# Patient Record
Sex: Female | Born: 1950 | Race: White | Hispanic: No | Smoking: Current every day smoker
Health system: Southern US, Community
[De-identification: ages and names within clinical notes are randomized; demographics above are authoritative.]

## PROBLEM LIST (undated history)

## (undated) DIAGNOSIS — F32A Depression, unspecified: Secondary | ICD-10-CM

## (undated) DIAGNOSIS — R748 Abnormal levels of other serum enzymes: Secondary | ICD-10-CM

## (undated) DIAGNOSIS — I10 Essential (primary) hypertension: Secondary | ICD-10-CM

## (undated) DIAGNOSIS — Z720 Tobacco use: Secondary | ICD-10-CM

## (undated) DIAGNOSIS — F1010 Alcohol abuse, uncomplicated: Secondary | ICD-10-CM

## (undated) DIAGNOSIS — Z87891 Personal history of nicotine dependence: Secondary | ICD-10-CM

## (undated) DIAGNOSIS — E039 Hypothyroidism, unspecified: Secondary | ICD-10-CM

## (undated) DIAGNOSIS — F419 Anxiety disorder, unspecified: Secondary | ICD-10-CM

## (undated) DIAGNOSIS — I951 Orthostatic hypotension: Secondary | ICD-10-CM

## (undated) DIAGNOSIS — E871 Hypo-osmolality and hyponatremia: Secondary | ICD-10-CM

## (undated) DIAGNOSIS — I071 Rheumatic tricuspid insufficiency: Secondary | ICD-10-CM

## (undated) DIAGNOSIS — E785 Hyperlipidemia, unspecified: Secondary | ICD-10-CM

## (undated) DIAGNOSIS — M810 Age-related osteoporosis without current pathological fracture: Secondary | ICD-10-CM

## (undated) DIAGNOSIS — N901 Moderate vulvar dysplasia: Secondary | ICD-10-CM

## (undated) DIAGNOSIS — D7589 Other specified diseases of blood and blood-forming organs: Secondary | ICD-10-CM

## (undated) DIAGNOSIS — F329 Major depressive disorder, single episode, unspecified: Secondary | ICD-10-CM

## (undated) DIAGNOSIS — J439 Emphysema, unspecified: Secondary | ICD-10-CM

## (undated) DIAGNOSIS — J45909 Unspecified asthma, uncomplicated: Secondary | ICD-10-CM

## (undated) HISTORY — DX: Hypothyroidism, unspecified: E03.9

## (undated) HISTORY — DX: Major depressive disorder, single episode, unspecified: F32.9

## (undated) HISTORY — DX: Anxiety disorder, unspecified: F41.9

## (undated) HISTORY — DX: Abnormal levels of other serum enzymes: R74.8

## (undated) HISTORY — PX: BREAST ENHANCEMENT SURGERY: SHX7

## (undated) HISTORY — DX: Hyperlipidemia, unspecified: E78.5

## (undated) HISTORY — PX: ABDOMINAL HYSTERECTOMY: SHX81

## (undated) HISTORY — DX: Other specified diseases of blood and blood-forming organs: D75.89

## (undated) HISTORY — DX: Personal history of nicotine dependence: Z87.891

## (undated) HISTORY — DX: Unspecified asthma, uncomplicated: J45.909

## (undated) HISTORY — DX: Hypo-osmolality and hyponatremia: E87.1

## (undated) HISTORY — DX: Moderate vulvar dysplasia: N90.1

## (undated) HISTORY — PX: SEPTOPLASTY: SUR1290

## (undated) HISTORY — DX: Depression, unspecified: F32.A

## (undated) HISTORY — DX: Age-related osteoporosis without current pathological fracture: M81.0

## (undated) HISTORY — DX: Alcohol abuse, uncomplicated: F10.10

## (undated) HISTORY — DX: Tobacco use: Z72.0

## (undated) HISTORY — DX: Orthostatic hypotension: I95.1

## (undated) HISTORY — DX: Emphysema, unspecified: J43.9

## (undated) HISTORY — PX: APPENDECTOMY: SHX54

## (undated) HISTORY — DX: Rheumatic tricuspid insufficiency: I07.1

## (undated) HISTORY — DX: Essential (primary) hypertension: I10

---

## 1988-08-08 HISTORY — PX: AUGMENTATION MAMMAPLASTY: SUR837

## 2005-05-27 ENCOUNTER — Ambulatory Visit: Payer: Self-pay | Admitting: Internal Medicine

## 2005-05-27 LAB — HM COLONOSCOPY

## 2014-03-12 LAB — HM PAP SMEAR

## 2014-04-25 LAB — HM MAMMOGRAPHY

## 2014-04-25 LAB — HM DEXA SCAN

## 2014-05-19 ENCOUNTER — Ambulatory Visit: Payer: Self-pay | Admitting: Obstetrics and Gynecology

## 2014-05-19 DIAGNOSIS — N890 Mild vaginal dysplasia: Secondary | ICD-10-CM | POA: Insufficient documentation

## 2014-05-19 LAB — HEMOGLOBIN: HGB: 13.6 g/dL (ref 12.0–16.0)

## 2014-05-26 ENCOUNTER — Ambulatory Visit: Payer: Self-pay | Admitting: Obstetrics and Gynecology

## 2014-05-28 LAB — PATHOLOGY REPORT

## 2014-08-27 ENCOUNTER — Ambulatory Visit: Payer: Self-pay | Admitting: Family Medicine

## 2014-08-27 DIAGNOSIS — I340 Nonrheumatic mitral (valve) insufficiency: Secondary | ICD-10-CM

## 2014-09-11 ENCOUNTER — Emergency Department: Payer: Self-pay | Admitting: Emergency Medicine

## 2014-09-11 LAB — COMPREHENSIVE METABOLIC PANEL
ALK PHOS: 92 U/L (ref 46–116)
ALT: 18 U/L (ref 14–63)
AST: 25 U/L (ref 15–37)
Albumin: 3.6 g/dL (ref 3.4–5.0)
Anion Gap: 6 — ABNORMAL LOW (ref 7–16)
BUN: 11 mg/dL (ref 7–18)
Bilirubin,Total: 0.6 mg/dL (ref 0.2–1.0)
Calcium, Total: 9.2 mg/dL (ref 8.5–10.1)
Chloride: 103 mmol/L (ref 98–107)
Co2: 28 mmol/L (ref 21–32)
Creatinine: 0.79 mg/dL (ref 0.60–1.30)
EGFR (African American): >60
EGFR (Non-African Amer.): >60
Glucose: 160 mg/dL — ABNORMAL HIGH (ref 65–99)
Osmolality: 277 (ref 275–301)
Potassium: 3.8 mmol/L (ref 3.5–5.1)
Sodium: 137 mmol/L (ref 136–145)
TOTAL PROTEIN: 6.8 g/dL (ref 6.4–8.2)

## 2014-09-11 LAB — CBC WITH DIFFERENTIAL/PLATELET
BASOS PCT: 1 %
Basophil #: 0.1 10*3/uL (ref 0.0–0.1)
Eosinophil #: 0.2 10*3/uL (ref 0.0–0.7)
Eosinophil %: 2 %
HCT: 42.9 % (ref 35.0–47.0)
HGB: 14.5 g/dL (ref 12.0–16.0)
Lymphocyte #: 2.3 10*3/uL (ref 1.0–3.6)
Lymphocyte %: 22.9 %
MCH: 34.1 pg — AB (ref 26.0–34.0)
MCHC: 33.9 g/dL (ref 32.0–36.0)
MCV: 101 fL — ABNORMAL HIGH (ref 80–100)
MONO ABS: 0.7 x10 3/mm (ref 0.2–0.9)
Monocyte %: 7 %
NEUTROS ABS: 6.8 10*3/uL — AB (ref 1.4–6.5)
NEUTROS PCT: 67.1 %
Platelet: 206 10*3/uL (ref 150–440)
RBC: 4.26 10*6/uL (ref 3.80–5.20)
RDW: 13.8 % (ref 11.5–14.5)
WBC: 10.2 10*3/uL (ref 3.6–11.0)

## 2014-09-17 ENCOUNTER — Ambulatory Visit (INDEPENDENT_AMBULATORY_CARE_PROVIDER_SITE_OTHER): Payer: Commercial Managed Care - HMO | Admitting: Cardiovascular Disease

## 2014-09-17 ENCOUNTER — Encounter: Payer: Self-pay | Admitting: Cardiovascular Disease

## 2014-09-17 VITALS — BP 130/82 | HR 80 | Ht 68.0 in | Wt 141.2 lb

## 2014-09-17 DIAGNOSIS — R9431 Abnormal electrocardiogram [ECG] [EKG]: Secondary | ICD-10-CM

## 2014-09-17 DIAGNOSIS — F17200 Nicotine dependence, unspecified, uncomplicated: Secondary | ICD-10-CM

## 2014-09-17 DIAGNOSIS — E785 Hyperlipidemia, unspecified: Secondary | ICD-10-CM

## 2014-09-17 DIAGNOSIS — I10 Essential (primary) hypertension: Secondary | ICD-10-CM

## 2014-09-17 DIAGNOSIS — I059 Rheumatic mitral valve disease, unspecified: Secondary | ICD-10-CM

## 2014-09-17 DIAGNOSIS — R0602 Shortness of breath: Secondary | ICD-10-CM

## 2014-09-17 DIAGNOSIS — I499 Cardiac arrhythmia, unspecified: Secondary | ICD-10-CM

## 2014-09-17 DIAGNOSIS — Z720 Tobacco use: Secondary | ICD-10-CM

## 2014-09-17 DIAGNOSIS — I951 Orthostatic hypotension: Secondary | ICD-10-CM

## 2014-09-17 MED ORDER — METOPROLOL SUCCINATE ER 50 MG PO TB24: 50.0000 mg | ORAL_TABLET | Freq: Every day | ORAL | Status: DC

## 2014-09-17 NOTE — Progress Notes (Signed)
Patient ID: Meghan Rivers, female    DOB: [birth year], 64 y.o.   MRN: [id number]  HPI Comments: Meghan Rivers is a pleasant 64 year old woman with a long history of smoking who continues to smoke one half pack per day, hypertension, hyperlipidemia who presents by referral from Dr. Sherie DonLada for abnormal EKG, mitral valve disease.  She has a history of alcohol, typically drinks 2-3 beers per day also history of depression, elevated liver enzymes, hypothyroidism  In follow-up today, she reports that she was on HCTZ for a long time as well as metoprolol succinate 100 mg daily. She had routine lab work in January 2016 and was found to have sodium 131. Follow-up sodium 137 one week later She is found to have labile blood pressures every fourth 2016. She recorded orthostatics with systolic pressure 73/46, pulse rate 106 when standing on Toprol 100 mg daily. She reports that she's been off HCTZ for approximately one month  She has now decreased the metoprolol down to 50 mg daily and recent blood pressure and heart rate has been well-controlled. Systolic pressures in the office today 130s. Orthostatics done with no drop in her blood pressure. Heart rate typically 70-90  She had echocardi[facility] one month ago that showed normal LV systolic function, normal RV systolic function, mitral annular calcification, mild MR, TR  EKG on today's visit shows normal sinus rhythm with rate 79 bpm, left axis deviation EKG done through primary care is essentially unchanged throughout EKG today   Allergies  Allergen Reactions  . Benazepril   . Erythromycin     Mouth pilled.     Outpatient Encounter Prescriptions as of 09/17/2014  Medication Sig  . albuterol (PROVENTIL HFA;VENTOLIN HFA) 108 (90 BASE) MCG/ACT inhaler Inhale into the lungs every 6 (six) hours as needed for wheezing or shortness of breath.  Marland Kitchen. aspirin 81 MG tablet Take 81 mg by mouth daily.  Marland Kitchen. atorvastatin (LIPITOR) 20 MG tablet Take 20 mg by mouth  daily.  . busPIRone (BUSPAR) 10 MG tablet Take 0.5 mg by mouth daily.  Marland Kitchen. levothyroxine (SYNTHROID, LEVOTHROID) 88 MCG tablet Take 88 mcg by mouth daily before breakfast.  . metoprolol succinate (TOPROL-XL) 50 MG 24 hr tablet Take 1 tablet (50 mg total) by mouth daily. Take with or immediately following a meal.  . [DISCONTINUED] metoprolol succinate (TOPROL-XL) 100 MG 24 hr tablet Take 50 mg by mouth daily. Take with or immediately following a meal.    Past Medical History  Diagnosis Date  . Hypertension   . Thyroid disease   . Hyperlipidemia   . Hypothyroidism   . Mitral annular calcification   . Mitral regurgitation   . Osteoporosis   . Anxiety and depression     Past Surgical History  Procedure Laterality Date  . Appendectomy    . Breast enhancement surgery    . Septoplasty    . Abdominal hysterectomy      Social History  reports that she has been smoking Cigarettes.  She has a 20 pack-year smoking history. She does not have any smokeless tobacco history on file. She reports that she drinks about 1.2 oz of alcohol per week. She reports that she does not use illicit drugs.  Family History family history includes Heart attack (age of onset: 2361) in her brother; Heart attack (age of onset: 162) in her father; Heart disease in her brother and father; Heart failure in her father; Hyperlipidemia in her father; Hypertension in her father.  Review of Systems  Constitutional: Negative.   HENT: Negative.   Eyes: Negative.   Respiratory: Negative.   Cardiovascular: Negative.   Gastrointestinal: Negative.   Endocrine: Negative.   Musculoskeletal: Negative.   Skin: Negative.   Allergic/Immunologic: Negative.   Neurological: Negative.   Hematological: Negative.   Psychiatric/Behavioral: Negative.   All other systems reviewed and are negative.   BP 130/82 mmHg  Pulse 80  Ht  (1.727 m)  Wt 141 lb 4 oz (64.071 kg)  BMI 21.48 kg/m2  Physical Exam  Constitutional:  She is oriented to person, place, and time. She appears well-developed and well-nourished.  HENT:  Head: Normocephalic.  Nose: Nose normal.  Mouth/Throat: Oropharynx is clear and moist.  Eyes: Conjunctivae are normal. Pupils are equal, round, and reactive to light.  Neck: Normal range of motion. Neck supple. No JVD present.  Cardiovascular: Normal rate, regular rhythm, S1 normal, S2 normal, normal heart sounds and intact distal pulses.  Exam reveals no gallop and no friction rub.   No murmur heard. Pulmonary/Chest: Effort normal and breath sounds normal. No respiratory distress. She has no wheezes. She has no rales. She exhibits no tenderness.  Abdominal: Soft. Bowel sounds are normal. She exhibits no distension. There is no tenderness.  Musculoskeletal: Normal range of motion. She exhibits no edema or tenderness.  Lymphadenopathy:    She has no cervical adenopathy.  Neurological: She is alert and oriented to person, place, and time. Coordination normal.  Skin: Skin is warm and dry. No rash noted. No erythema.  Psychiatric: She has a normal mood and affect. Her behavior is normal. Judgment and thought content normal.    Assessment and Plan  Nursing note and vitals reviewed.

## 2014-09-17 NOTE — Assessment & Plan Note (Signed)
She takes Lipitor 20 mg every other day. Given her smoking, suggested aggressive cholesterol management, total cholesterol less than 150.

## 2014-09-17 NOTE — Assessment & Plan Note (Signed)
Blood pressure has been much better controlled on metoprolol succinate 50 mg daily. Encouraged her to use the lower dose, continue to hold HCTZ. Unclear if her nutrition or fluid intake was playing a role in her orthostasis. Significant alcohol intake. She is drinking more Gatorade now

## 2014-09-17 NOTE — Assessment & Plan Note (Signed)
Recent echocardiogram with mitral annular calcification. Likely not of any clinical significance. Mild MR and TR. Also does not need to be closely monitored. Normal ejection fraction

## 2014-09-17 NOTE — Assessment & Plan Note (Signed)
Blood pressure is well controlled on today's visit. No changes made to the medications. Suggested she stay on low-dose metoprolol succinate 50 mg daily

## 2014-09-17 NOTE — Patient Instructions (Addendum)
You are doing well.  No medication changes were made. Continue the metoprolol succinate 50 mg daily  Goal total cholesterol is <150, LDL <70  Try to quit smoking If you have dizzy spells, drops in blood pressure, call the office   Please call us if you have new issues that need to be addressed before your next appt.

## 2014-09-17 NOTE — Assessment & Plan Note (Signed)
We have encouraged her to continue to work on weaning her cigarettes and smoking cessation. She will continue to work on this and does not want any assistance with chantix.  

## 2014-11-29 NOTE — Op Note (Signed)
PATIENT NAME:  Meghan EllisBASS, Deronda D MR#:  [id number] DATE OF BIRTH:  [birth year]  DATE OF PROCEDURE:  05/26/2014  PREOPERATIVE DIAGNOSIS: VIN 3.  POSTOPERATIVE DIAGNOSIS: VIN 3.   OPERATION: Vulvar wide local excision.   ANESTHESIA: General.   SURGEON: Lynanne Delgreco S. Valentino Saxonherry, MD  ESTIMATED BLOOD LOSS: Minimal.   OPERATIVE FLUIDS: 800 mL   URINE OUTPUT: 10 mL   COMPLICATIONS: None.   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 Acetyl white changes were also noted in this region after application of acetic acid. Mild aceto-white change was also noted on the right labia majora at the junction of the labia minora near the region of the clitoris.   SPECIMEN: Vulvar biopsy x 2.   CONDITION: Stable.   PROCEDURE: The patient was taken to the operating room where she was prepped and draped in normal sterile fashion, and placed in the dorsal lithotomy position using Allen stirrups. The bladder was then drained using a straight catheter with approximately 10 mL of clear urine returned. Next, 5% acetic acid was applied to the patient's entire vulva, and observation for any acetyl white changes was done. The areas with acetyl white changes were as noted above.   A scalpel was used to incise the skin surrounding the aceto-white lesions with a clear margin of approximately 1 cm, and electrocautery was used to undermine this. These specimens were then sent to the pathologist for evaluation.  After removal of the specimens, the wounds were closed primarily with subcutaneous interrupted sutures of 0 chromic suture.   The final sponge, needle and instrument counts were correct at the completion of the procedure. The patient was taken to the PACU in stable condition.    ____________________________ Jacques EarthlyAnika S. Valentino Saxonherry, MD asc:MT D: 05/29/2014 17:27:49 ET T: 05/30/2014 09:04:47 ET JOB#: [id number]  cc: Jacques EarthlyAnika S. Valentino Saxonherry, MD,  <Dictator> Fabian NovemberANIKA S Adorian Gwynne MD ELECTRONICALLY SIGNED 06/09/2014 7:57

## 2015-04-07 ENCOUNTER — Other Ambulatory Visit: Payer: Self-pay

## 2015-04-07 NOTE — Telephone Encounter (Signed)
Routing to provider  

## 2015-05-06 ENCOUNTER — Other Ambulatory Visit: Payer: Self-pay

## 2015-05-06 DIAGNOSIS — F1010 Alcohol abuse, uncomplicated: Secondary | ICD-10-CM | POA: Insufficient documentation

## 2015-05-06 DIAGNOSIS — F32A Depression, unspecified: Secondary | ICD-10-CM | POA: Insufficient documentation

## 2015-05-06 DIAGNOSIS — D7589 Other specified diseases of blood and blood-forming organs: Secondary | ICD-10-CM | POA: Insufficient documentation

## 2015-05-06 DIAGNOSIS — D071 Carcinoma in situ of vulva: Secondary | ICD-10-CM | POA: Insufficient documentation

## 2015-05-06 DIAGNOSIS — F329 Major depressive disorder, single episode, unspecified: Secondary | ICD-10-CM | POA: Insufficient documentation

## 2015-05-06 DIAGNOSIS — M810 Age-related osteoporosis without current pathological fracture: Secondary | ICD-10-CM | POA: Insufficient documentation

## 2015-05-06 DIAGNOSIS — Z720 Tobacco use: Secondary | ICD-10-CM | POA: Insufficient documentation

## 2015-05-06 DIAGNOSIS — I3481 Nonrheumatic mitral (valve) annulus calcification: Secondary | ICD-10-CM | POA: Insufficient documentation

## 2015-05-06 DIAGNOSIS — F419 Anxiety disorder, unspecified: Secondary | ICD-10-CM | POA: Insufficient documentation

## 2015-05-06 DIAGNOSIS — J45909 Unspecified asthma, uncomplicated: Secondary | ICD-10-CM | POA: Insufficient documentation

## 2015-05-06 DIAGNOSIS — R748 Abnormal levels of other serum enzymes: Secondary | ICD-10-CM | POA: Insufficient documentation

## 2015-05-06 DIAGNOSIS — E871 Hypo-osmolality and hyponatremia: Secondary | ICD-10-CM | POA: Insufficient documentation

## 2015-05-06 DIAGNOSIS — E039 Hypothyroidism, unspecified: Secondary | ICD-10-CM | POA: Insufficient documentation

## 2015-05-06 DIAGNOSIS — I059 Rheumatic mitral valve disease, unspecified: Secondary | ICD-10-CM | POA: Insufficient documentation

## 2015-05-06 MED ORDER — BUSPIRONE HCL 10 MG PO TABS: 10.0000 mg | ORAL_TABLET | Freq: Two times a day (BID) | ORAL | Status: DC | PRN

## 2015-05-06 NOTE — Telephone Encounter (Signed)
rx approved

## 2015-05-11 ENCOUNTER — Ambulatory Visit (INDEPENDENT_AMBULATORY_CARE_PROVIDER_SITE_OTHER): Payer: Commercial Managed Care - HMO | Admitting: Family Medicine

## 2015-05-11 ENCOUNTER — Ambulatory Visit
Admission: RE | Admit: 2015-05-11 | Discharge: 2015-05-11 | Disposition: A | Discharge status: Home / Self Care | Payer: Commercial Managed Care - HMO | Source: Ambulatory Visit | Attending: Family Medicine | Admitting: Family Medicine

## 2015-05-11 ENCOUNTER — Telehealth: Payer: Self-pay | Admitting: Family Medicine

## 2015-05-11 ENCOUNTER — Ambulatory Visit: Payer: Commercial Managed Care - HMO

## 2015-05-11 ENCOUNTER — Encounter: Payer: Self-pay | Admitting: Family Medicine

## 2015-05-11 VITALS — BP 136/87 | HR 75 | Temp 98.4°F | Ht 66.2 in | Wt 132.0 lb

## 2015-05-11 DIAGNOSIS — I10 Essential (primary) hypertension: Secondary | ICD-10-CM | POA: Diagnosis not present

## 2015-05-11 DIAGNOSIS — N901 Moderate vulvar dysplasia: Secondary | ICD-10-CM | POA: Diagnosis not present

## 2015-05-11 DIAGNOSIS — R748 Abnormal levels of other serum enzymes: Secondary | ICD-10-CM | POA: Diagnosis not present

## 2015-05-11 DIAGNOSIS — E038 Other specified hypothyroidism: Secondary | ICD-10-CM | POA: Diagnosis not present

## 2015-05-11 DIAGNOSIS — D7589 Other specified diseases of blood and blood-forming organs: Secondary | ICD-10-CM

## 2015-05-11 DIAGNOSIS — F1010 Alcohol abuse, uncomplicated: Secondary | ICD-10-CM

## 2015-05-11 DIAGNOSIS — R634 Abnormal weight loss: Secondary | ICD-10-CM

## 2015-05-11 DIAGNOSIS — F17200 Nicotine dependence, unspecified, uncomplicated: Secondary | ICD-10-CM | POA: Diagnosis present

## 2015-05-11 DIAGNOSIS — Z720 Tobacco use: Secondary | ICD-10-CM | POA: Diagnosis not present

## 2015-05-11 DIAGNOSIS — E785 Hyperlipidemia, unspecified: Secondary | ICD-10-CM | POA: Diagnosis not present

## 2015-05-11 NOTE — Assessment & Plan Note (Signed)
Check lipids and sgpt today; diet discussed

## 2015-05-11 NOTE — Assessment & Plan Note (Addendum)
This has been cut down; praise given, but explained recommended limits are still no more than 7 drinks per week, no more than 3 drinks on any one occasion; offered help, here to help if needed; see AVS

## 2015-05-11 NOTE — Patient Instructions (Addendum)
Please have the chest xray done today or tomorrow at [facility] contact you about the labs and xray results If you have not heard anything from my staff in a week about any orders/referrals/studies from today, please contact us here to follow-up [phone] Do try to cut back further on your alcohol intake and tobacco use  I recommend a chest CT to screen for lung cancer; if you change your mind and would like to have that done, please let me know   Alcohol and Nutrition Nutrition serves two purposes. It provides energy. It also maintains body structure and function. Food supplies energy. It also provides the building blocks needed to replace worn or damaged cells. Alcoholics often eat poorly. This limits their supply of essential nutrients. This affects energy supply and structure maintenance. Alcohol also affects the body's nutrients in:  Digestion.  Storage.  Using and getting rid of waste products. IMPAIRMENT OF NUTRIENT DIGESTION AND UTILIZATION   Once ingested, food must be broken down into small components (digested). Then it is available for energy. It helps maintain body structure and function. Digestion begins in the mouth. It continues in the stomach and intestines, with help from the pancreas. The nutrients from digested food are absorbed from the intestines into the blood. Then they are carried to the liver. The liver prepares nutrients for:  Immediate use.  Storage and future use.  Alcohol inhibits the breakdown of nutrients into usable molecules.  It decreases secretion of digestive enzymes from the pancreas.  Alcohol impairs nutrient absorption by damaging the cells lining the stomach and intestines.  It also interferes with moving some nutrients into the blood.  In addition, nutritional deficiencies themselves may lead to further absorption problems.  For example, folate deficiency changes the cells that line the small intestine. This  impairs how water is absorbed. It also affects absorbed nutrients. These include glucose, sodium, and additional folate.  Even if nutrients are digested and absorbed, alcohol can prevent them from being fully used. It changes their transport, storage, and excretion. Impaired utilization of nutrients by alcoholics is indicated by:  Decreased liver stores of vitamins, such as vitamin A.  Increased excretion of nutrients such as fat. ALCOHOL AND ENERGY SUPPLY   Three basic nutritional components found in food are:  Carbohydrates.  Proteins.  Fats.  These are used as energy. Some alcoholics take in as much as 50% of their total daily calories from alcohol. They often neglect important foods.  Even when enough food is eaten, alcohol can impair the ways the body controls blood sugar (glucose) levels. It may either increase or decrease blood sugar.  In non-diabetic alcoholics, increased blood sugar (hyperglycemia) is caused by poor insulin secretion. It is usually temporary.  Decreased blood sugar (hypoglycemia) can cause serious injury even if this condition is short-lived. Low blood sugar can happen when a fasting or malnourished person drinks alcohol. When there is no food to supply energy, stored sugar is used up. The products of alcohol inhibit forming glucose from other compounds such as amino acids. As a result, alcohol causes the brain and other body tissue to lack glucose. It is needed for energy and function.  Alcohol is an energy source. But how the body processes and uses the energy from alcohol is complex. Also, when alcohol is substituted for carbohydrates, subjects tend to lose weight. This indicates that they get less energy from alcohol than from food. ALCOHOL - MAINTAINING CELL STRUCTURE AND FUNCTION  Structure  Cells are made mostly of protein. So an adequate protein diet is important for maintaining cell structure. This is especially true if cells are being damaged. Research  indicates that alcohol affects protein nutrition by causing impaired:  Digestion of proteins to amino acids.  Processing of amino acids by the small intestine and liver.  Synthesis of proteins from amino acids.  Protein secretion by the liver. Function Nutrients are essential for the body to function well. They provide the tools that the body needs to work well:   Proteins.  Vitamins.  Minerals. Alcohol can disrupt body function. It may cause nutrient deficiencies. And it may interfere with the way nutrients are processed. Vitamins  Vitamins are essential to maintain growth and normal metabolism. They regulate many of the body`s processes. Chronic heavy drinking causes deficiencies in many vitamins. This is caused by eating less. And, in some cases, vitamins may be poorly absorbed. For example, alcohol inhibits fat absorption. It impairs how the vitamins A, E, and D are normally absorbed along with dietary fats. Not enough vitamin A may cause night blindness. Not enough vitamin D may cause softening of the bones.  Some alcoholics lack vitamins A, C, D, E, K, and the B vitamins. These are all involved in wound healing and cell maintenance. In particular, because vitamin K is necessary for blood clotting, lacking that vitamin can cause delayed clotting. The result is excess bleeding. Lacking other vitamins involved in brain function may cause severe neurological damage. Minerals Deficiencies of minerals such as calcium, magnesium, iron, and zinc are common in alcoholics. The alcohol itself does not seem to affect how these minerals are absorbed. Rather, they seem to occur secondary to other alcohol-related problems, such as:  Less calcium absorbed.  Not enough magnesium.  More urinary excretion.  Vomiting.  Diarrhea.  Not enough iron due to gastrointestinal bleeding.  Not enough zinc or losses related to other nutrient deficiencies.  Mineral deficiencies can cause a variety of  medical consequences. These range from calcium-related bone disease to zinc-related night blindness and skin lesions. ALCOHOL, MALNUTRITION, AND MEDICAL COMPLICATIONS  Liver Disease   Alcoholic liver damage is caused primarily by alcohol itself. But poor nutrition may increase the risk of alcohol-related liver damage. For example, nutrients normally found in the liver are known to be affected by drinking alcohol. These include carotenoids, which are the major sources of vitamin A, and vitamin E compounds. Decreases in such nutrients may play some role in alcohol-related liver damage. Pancreatitis  Research suggests that malnutrition may increase the risk of developing alcoholic pancreatitis. Research suggests that a diet lacking in protein may increase alcohol's damaging effect on the pancreas. Brain  Nutritional deficiencies may have severe effects on brain function. These may be permanent. Specifically, thiamine deficiencies are often seen in alcoholics. They can cause severe neurological problems. These include:  Impaired movement.  Memory loss seen in Wernicke-Korsakoff syndrome. Pregnancy  Alcohol has toxic effects on fetal development. It causes alcohol-related birth defects. They include fetal alcohol syndrome. Alcohol itself is toxic to the fetus. Also, the nutritional deficiency can affect how the fetus develops. That may compound the risk of developmental damage.  Nutritional needs during pregnancy are 10% to 30% greater than normal. Food intake can increase by as much as 140% to cover the needs of both mother and fetus. An alcoholic mother`s nutritional problems may adversely affect the nutrition of the fetus. And alcohol itself can also restrict nutrition flow to the fetus. NUTRITIONAL STATUS OF ALCOHOLICS  Techniques for assessing nutritional status include:  Taking body measurements to estimate fat reserves. They include:  Weight.  Height.  Mass.  Skin fold  thickness.  Performing blood analysis to provide measurements of circulating:  Proteins.  Vitamins.  Minerals.  These techniques tend to be imprecise. For many nutrients, there is no clear "cut-off" point that would allow an accurate definition of deficiency. So assessing the nutritional status of alcoholics is limited by these techniques. Dietary status may provide information about the risk of developing nutritional problems. Dietary status is assessed by:  Taking patients' dietary histories.  Evaluating the amount and types of food they are eating.  It is difficult to determine what exact amount of alcohol begins to have damaging effects on nutrition. In general, moderate drinkers have 2 drinks or less per day. They seem to be at little risk for nutritional problems. Various medical disorders begin to appear at greater levels.  Research indicates that the majority of even the heaviest drinkers have few obvious nutritional deficiencies. Many alcoholics who are hospitalized for medical complications of their disease do have severe malnutrition. Alcoholics tend to eat poorly. Often they eat less than the amounts of food necessary to provide enough:  Carbohydrates.  Protein.  Fat.  Vitamins A and C.  B vitamins.  Minerals like calcium and iron. Of major concern is alcohol's effect on digesting food and use of nutrients. It may shift a mildly malnourished person toward severe malnutrition. Document Released: 05/19/2005 Document Revised: 10/17/2011 Document Reviewed: 11/02/2005 [facility] Patient Information 2015 [place], [place]. This information is not intended to replace advice given to you by your health care provider. Make sure you discuss any questions you have with your health care provider. Smoking Hazards Smoking cigarettes is extremely bad for your health. Tobacco smoke has over 200 known poisons in it. It contains the poisonous gases nitrogen oxide and carbon monoxide. There  are over 60 chemicals in tobacco smoke that cause cancer. Some of the chemicals found in cigarette smoke include:   Cyanide.   Benzene.   Formaldehyde.   Methanol (wood alcohol).   Acetylene (fuel used in welding torches).   Ammonia.  Even smoking lightly shortens your life expectancy by several years. You can greatly reduce the risk of medical problems for you and your family by stopping now. Smoking is the most preventable cause of death and disease in our society. Within days of quitting smoking, your circulation improves, you decrease the risk of having a heart attack, and your lung capacity improves. There may be some increased phlegm in the first few days after quitting, and it may take months for your lungs to clear up completely. Quitting for 10 years reduces your risk of developing lung cancer to almost that of a nonsmoker.  WHAT ARE THE RISKS OF SMOKING? Cigarette smokers have an increased risk of many serious medical problems, including:  Lung cancer.   Lung disease (such as pneumonia, bronchitis, and emphysema).   Heart attack and chest pain due to the heart not getting enough oxygen (angina).   Heart disease and peripheral blood vessel disease.   Hypertension.   Stroke.   Oral cancer (cancer of the lip, mouth, or voice box).   Bladder cancer.   Pancreatic cancer.   Cervical cancer.   Pregnancy complications, including premature birth.   Stillbirths and smaller newborn babies, birth defects, and genetic damage to sperm.   Early menopause.   Lower estrogen level for women.   Infertility.   Facial  wrinkles.   Blindness.   Increased risk of broken bones (fractures).   Senile dementia.   Stomach ulcers and internal bleeding.   Delayed wound healing and increased risk of complications during surgery. Because of secondhand smoke exposure, children of smokers have an increased risk of the following:   Sudden infant death  syndrome (SIDS).   Respiratory infections.   Lung cancer.   Heart disease.   Ear infections.  WHY IS SMOKING ADDICTIVE? Nicotine is the chemical agent in tobacco that is capable of causing addiction or dependence. When you smoke and inhale, nicotine is absorbed rapidly into the bloodstream through your lungs. Both inhaled and noninhaled nicotine may be addictive.  WHAT ARE THE BENEFITS OF QUITTING?  There are many health benefits to quitting smoking. Some are:   The likelihood of developing cancer and heart disease decreases. Health improvements are seen almost immediately.   Blood pressure, pulse rate, and breathing patterns start returning to normal soon after quitting.   People who quit may see an improvement in their overall quality of life.  HOW DO YOU QUIT SMOKING? Smoking is an addiction with both physical and psychological effects, and longtime habits can be hard to change. Your health care provider can recommend:  Programs and community resources, which may include group support, education, or therapy.  Replacement products, such as patches, gum, and nasal sprays. Use these products only as directed. Do not replace cigarette smoking with electronic cigarettes (commonly called e-cigarettes). The safety of e-cigarettes is unknown, and some may contain harmful chemicals. FOR MORE INFORMATION  American Lung Association: www.lung.org  American Cancer Society: www.cancer.org Document Released: 09/01/2004 Document Revised: 05/15/2013 Document Reviewed: 01/14/2013 [facility] Patient Information 2015 [place], [place]. This information is not intended to replace advice given to you by your health care provider. Make sure you discuss any questions you have with your health care provider. Smoking Cessation, Tips for Success If you are ready to quit smoking, congratulations! You have chosen to help yourself be healthier. Cigarettes bring nicotine, tar, carbon monoxide, and other  irritants into your body. Your lungs, heart, and blood vessels will be able to work better without these poisons. There are many different ways to quit smoking. Nicotine gum, nicotine patches, a nicotine inhaler, or nicotine nasal spray can help with physical craving. Hypnosis, support groups, and medicines help break the habit of smoking. WHAT THINGS CAN I DO TO MAKE QUITTING EASIER?  Here are some tips to help you quit for good:  Pick a date when you will quit smoking completely. Tell all of your friends and family about your plan to quit on that date.  Do not try to slowly cut down on the number of cigarettes you are smoking. Pick a quit date and quit smoking completely starting on that day.  Throw away all cigarettes.   Clean and remove all ashtrays from your home, work, and car.  On a card, write down your reasons for quitting. Carry the card with you and read it when you get the urge to smoke.  Cleanse your body of nicotine. Drink enough water and fluids to keep your urine clear or pale yellow. Do this after quitting to flush the nicotine from your body.  Learn to predict your moods. Do not let a bad situation be your excuse to have a cigarette. Some situations in your life might tempt you into wanting a cigarette.  Never have "just one" cigarette. It leads to wanting another and another. Remind yourself of your  decision to quit.  Change habits associated with smoking. If you smoked while driving or when feeling stressed, try other activities to replace smoking. Stand up when drinking your coffee. Brush your teeth after eating. Sit in a different chair when you read the paper. Avoid alcohol while trying to quit, and try to drink fewer caffeinated beverages. Alcohol and caffeine may urge you to smoke.  Avoid foods and drinks that can trigger a desire to smoke, such as sugary or spicy foods and alcohol.  Ask people who smoke not to smoke around you.  Have something planned to do right  after eating or having a cup of coffee. For example, plan to take a walk or exercise.  Try a relaxation exercise to calm you down and decrease your stress. Remember, you may be tense and nervous for the first 2 weeks after you quit, but this will pass.  Find new activities to keep your hands busy. Play with a pen, coin, or rubber band. Doodle or draw things on paper.  Brush your teeth right after eating. This will help cut down on the craving for the taste of tobacco after meals. You can also try mouthwash.   Use oral substitutes in place of cigarettes. Try using lemon drops, carrots, cinnamon sticks, or chewing gum. Keep them handy so they are available when you have the urge to smoke.  When you have the urge to smoke, try deep breathing.  Designate your home as a nonsmoking area.  If you are a heavy smoker, ask your health care provider about a prescription for nicotine chewing gum. It can ease your withdrawal from nicotine.  Reward yourself. Set aside the cigarette money you save and buy yourself something nice.  Look for support from others. Join a support group or smoking cessation program. Ask someone at home or at work to help you with your plan to quit smoking.  Always ask yourself, "Do I need this cigarette or is this just a reflex?" Tell yourself, "Today, I choose not to smoke," or "I do not want to smoke." You are reminding yourself of your decision to quit.  Do not replace cigarette smoking with electronic cigarettes (commonly called e-cigarettes). The safety of e-cigarettes is unknown, and some may contain harmful chemicals.  If you relapse, do not give up! Plan ahead and think about what you will do the next time you get the urge to smoke. HOW WILL I FEEL WHEN I QUIT SMOKING? You may have symptoms of withdrawal because your body is used to nicotine (the addictive substance in cigarettes). You may crave cigarettes, be irritable, feel very hungry, cough often, get headaches, or  have difficulty concentrating. The withdrawal symptoms are only temporary. They are strongest when you first quit but will go away within 10-14 days. When withdrawal symptoms occur, stay in control. Think about your reasons for quitting. Remind yourself that these are signs that your body is healing and getting used to being without cigarettes. Remember that withdrawal symptoms are easier to treat than the major diseases that smoking can cause.  Even after the withdrawal is over, expect periodic urges to smoke. However, these cravings are generally short lived and will go away whether you smoke or not. Do not smoke! WHAT RESOURCES ARE AVAILABLE TO HELP ME QUIT SMOKING? Your health care provider can direct you to community resources or hospitals for support, which may include:  Group support.  Education.  Hypnosis.  Therapy. Document Released: 04/22/2004 Document Revised: 12/09/2013 Document Reviewed:  01/10/2013 [employer] Patient Information 2015 [place], [place]. This information is not intended to replace advice given to you by your health care provider. Make sure you discuss any questions you have with your health care provider.

## 2015-05-11 NOTE — Assessment & Plan Note (Signed)
She is not ready to quit yet, I am here to help if/when needed

## 2015-05-11 NOTE — Assessment & Plan Note (Signed)
Check TSH and free T4 today; adjust medicine if needed

## 2015-05-11 NOTE — Assessment & Plan Note (Signed)
Check LFTs today; hope improvement with less alcohol intake

## 2015-05-11 NOTE — Assessment & Plan Note (Signed)
Expect this to have improved with B12 and folic acid and reduced alcohol intake; numbness has iimproved as well; check CBC and then f/u with other labs if needed

## 2015-05-11 NOTE — Progress Notes (Signed)
BP 136/87 mmHg  Pulse 75  Temp(Src) 98.4 F (36.9 C)  Ht 5' 6.2" (1.681 m)  Wt 132 lb (59.875 kg)  BMI 21.19 kg/m2  SpO2 99%   Subjective:    Patient ID: Meghan Rivers, female    DOB: 1950-08-11, 64 y.o.   MRN: [id number]  HPI: Meghan Rivers is a 64 y.o. female  Chief Complaint  Patient presents with  . Hypertension    patient states that she was getting dizzy, she checked her BP, it was low she at that time she cut ther BP meds in half. She is now taking a whole tablet  . Hypothyroidism  . Hyperlipidemia   She had a few days of feeling dizzy; salting her foods, drinking Gatorade; cut metoporlol in half about 3 weeks ago No Lynn Ito since Sept 1st; her favorite type of alcohol; drinking 2 or 2.5 beers a day and brandy in the evening; also drinking iced tea Drinking at least 2 liters of fluids daily; "addicted to water in the mornings", drinking 6 glasses of water in the morning, before lunch, 6 ounce glasses; then 2 glasses of tea, slightly sweetened; has cut back on coffee  She has lost 9 pounds since February; no appetite really; she has not lost the sense of taste or smell; nothing really sounds good to eat; not really hungry; no early satiety; will get a little hungry, but never "starving"  Occasional hot flash since the thyroid med adjustment, we went down from 88 to 75 mcg daily; she toyed with whether or not to go back up to 88; she has been taking 75 mcg every day; bowels are regular  Macrocytosis; taking B12 and folic acid; numbness in the feet improved  High cholesterol; using olive oil when cooking; using coconut oil but not with cooking; not many eggs; not much sausage, hot dogs; does have pepperoni and ham at [place] [employer] once a week; rarely eats cheeseburgers and french fries; does eat BLT; cheese eating comes in spells; does not drink milk much; taking statin every other day instead of daily because of concerns of what she has heard about gray matter  Relevant  past medical, surgical, family and social history reviewed and updated as indicated. Interim medical history since our last visit reviewed. Allergies and medications reviewed and updated.  Review of Systems  Constitutional: Positive for unexpected weight change.  HENT: Positive for postnasal drip. Negative for nosebleeds.   Eyes: Negative for visual disturbance.  Respiratory: Positive for cough (occasional tickle in the throat, no sputum, no blood, just sinus drainage).   Gastrointestinal: Negative for blood in stool.  Endocrine: Positive for cold intolerance. Negative for heat intolerance, polydipsia (she declined excessive thirst, just trying to stay hydrated) and polyphagia.  Genitourinary: Positive for vaginal discharge. Negative for hematuria.  Musculoskeletal: Positive for arthralgias. Negative for joint swelling.       Only has some hand discomfort if playing games too long or making candy too long  Skin:       Rough brown bump on the left forearm  Neurological: Positive for numbness. Negative for tremors, weakness (feels lazy but not weak) and headaches.  Psychiatric/Behavioral: Positive for dysphoric mood ("I think I've been depressed but I'm getting over that").   Per HPI unless specifically indicated above     Objective:    BP 136/87 mmHg  Pulse 75  Temp(Src) 98.4 F (36.9 C)  Ht 5' 6.2" (1.681 m)  Wt 132 lb (59.875 kg)  BMI 21.19 kg/m2  SpO2 99%  Wt Readings from Last 3 Encounters:  05/11/15 132 lb (59.875 kg)  12/02/14 138 lb (62.596 kg)  09/17/14 141 lb 4 oz (64.071 kg)    Physical Exam  Constitutional: She appears well-developed and well-nourished. No distress.  Weight down 6 pounds in 5-1/2 months, down 9 pounds in about 8 months  HENT:  Head: Normocephalic and atraumatic.  Eyes: EOM are normal. No scleral icterus.  Neck: No thyromegaly present.  Cardiovascular: Normal rate, regular rhythm and normal heart sounds.   No murmur heard. Pulmonary/Chest: Effort  normal and breath sounds normal. No respiratory distress. She has no wheezes.  Abdominal: Soft. Bowel sounds are normal. She exhibits no distension.  Musculoskeletal: Normal range of motion. She exhibits no edema.  Neurological: She is alert. She displays no tremor. She exhibits normal muscle tone. Gait normal.  Sensation intact to monofilament toes left foot  Skin: Skin is warm and dry. She is not diaphoretic. No pallor.  Psychiatric: She has a normal mood and affect. Her behavior is normal. Judgment and thought content normal.   Results for orders placed or performed in visit on 05/06/15  HM MAMMOGRAPHY  Result Value Ref Range   HM Mammogram per PP   HM DEXA SCAN  Result Value Ref Range   HM Dexa Scan per PP   HM PAP SMEAR  Result Value Ref Range   HM Pap smear per PP   HM COLONOSCOPY  Result Value Ref Range   HM Colonoscopy per PP       Assessment & Plan:   Problem List Items Addressed This Visit      Cardiovascular and Mediastinum   Essential hypertension    Agree with lower dose of the medicine when dizzy, but she is back on 50 mg now and well-controlled today        Endocrine   Hypothyroidism - Primary    Check TSH and free T4 today; adjust medicine if needed      Relevant Orders   TSH   T4, free     Musculoskeletal and Integument   Vulvar intraepithelial neoplasia II    Followed by Dr. Valentino Saxon, sees her next month        Other   Hyperlipidemia    Check lipids and sgpt today; diet discussed      Relevant Orders   Lipid Panel w/o Chol/HDL Ratio   Elevated liver enzymes    Check LFTs today; hope improvement with less alcohol intake      Relevant Orders   Comprehensive metabolic panel   Tobacco use    She is not ready to quit yet, I am here to help if/when needed      Relevant Orders   DG Chest 2 View   Macrocytosis    Expect this to have improved with B12 and folic acid and reduced alcohol intake; numbness has iimproved as well; check CBC and  then f/u with other labs if needed      Relevant Orders   CBC with Differential/Platelet   Excessive drinking of alcohol    This has been cut down; praise given, but explained recommended limits are still no more than 7 drinks per week, no more than 3 drinks on any one occasion; offered help, here to help if needed; see AVS      Relevant Orders   Comprehensive metabolic panel    Other Visit Diagnoses    Abnormal weight loss  check CXR, labs today; stool cards done recently per patient; she declined chest CT to look for lung cancer    Relevant Orders    DG Chest 2 View        Follow up plan: Return in about 7 weeks (around 06/29/2015), or on or after Jun 28, 2015, for for 6 week follow-up and Medicare visit.

## 2015-05-11 NOTE — Assessment & Plan Note (Signed)
Agree with lower dose of the medicine when dizzy, but she is back on 50 mg now and well-controlled today

## 2015-05-11 NOTE — Telephone Encounter (Signed)
Please let pt know nothing on chest xray to explain weight loss She does have some findings associated with cigarette smoking, so I'll still be here to help if and when she is ready to quit

## 2015-05-11 NOTE — Assessment & Plan Note (Signed)
Followed by Dr. Valentino Saxon, sees her next month

## 2015-05-12 ENCOUNTER — Encounter: Payer: Self-pay | Admitting: Family Medicine

## 2015-05-12 LAB — CBC WITH DIFFERENTIAL/PLATELET
BASOS: 0 %
Basophils Absolute: 0 10*3/uL (ref 0.0–0.2)
EOS (ABSOLUTE): 0.2 10*3/uL (ref 0.0–0.4)
EOS: 2 %
HEMATOCRIT: 40.6 % (ref 34.0–46.6)
Hemoglobin: 14.5 g/dL (ref 11.1–15.9)
Immature Grans (Abs): 0 10*3/uL (ref 0.0–0.1)
Immature Granulocytes: 0 %
Lymphocytes Absolute: 2.1 10*3/uL (ref 0.7–3.1)
Lymphs: 22 %
MCH: 34.9 pg — ABNORMAL HIGH (ref 26.6–33.0)
MCHC: 35.7 g/dL (ref 31.5–35.7)
MCV: 98 fL — AB (ref 79–97)
MONOS ABS: 0.7 10*3/uL (ref 0.1–0.9)
Monocytes: 8 %
Neutrophils Absolute: 6.2 10*3/uL (ref 1.4–7.0)
Neutrophils: 68 %
Platelets: 240 10*3/uL (ref 150–379)
RBC: 4.15 x10E6/uL (ref 3.77–5.28)
RDW: 12.9 % (ref 12.3–15.4)
WBC: 9.2 10*3/uL (ref 3.4–10.8)

## 2015-05-12 LAB — COMPREHENSIVE METABOLIC PANEL
A/G RATIO: 1.7 (ref 1.1–2.5)
ALBUMIN: 3.9 g/dL (ref 3.6–4.8)
ALT: 9 IU/L (ref 0–32)
AST: 16 IU/L (ref 0–40)
Alkaline Phosphatase: 102 IU/L (ref 39–117)
BILIRUBIN TOTAL: 0.9 mg/dL (ref 0.0–1.2)
BUN / CREAT RATIO: 9 — AB (ref 11–26)
BUN: 5 mg/dL — ABNORMAL LOW (ref 8–27)
CHLORIDE: 97 mmol/L (ref 97–108)
CO2: 26 mmol/L (ref 18–29)
Calcium: 9.1 mg/dL (ref 8.7–10.3)
Creatinine, Ser: 0.58 mg/dL (ref 0.57–1.00)
GFR calc non Af Amer: 97 mL/min/{1.73_m2} (ref >59)
GFR, EST AFRICAN AMERICAN: 112 mL/min/{1.73_m2} (ref >59)
Globulin, Total: 2.3 g/dL (ref 1.5–4.5)
Glucose: 98 mg/dL (ref 65–99)
POTASSIUM: 4.1 mmol/L (ref 3.5–5.2)
Sodium: 138 mmol/L (ref 134–144)
TOTAL PROTEIN: 6.2 g/dL (ref 6.0–8.5)

## 2015-05-12 LAB — LIPID PANEL W/O CHOL/HDL RATIO
Cholesterol, Total: 146 mg/dL (ref 100–199)
HDL: 62 mg/dL (ref >39)
LDL Calculated: 63 mg/dL (ref 0–99)
TRIGLYCERIDES: 104 mg/dL (ref 0–149)
VLDL Cholesterol Cal: 21 mg/dL (ref 5–40)

## 2015-05-12 LAB — TSH: TSH: 0.63 u[IU]/mL (ref 0.450–4.500)

## 2015-05-12 LAB — T4, FREE: Free T4: 1.48 ng/dL (ref 0.82–1.77)

## 2015-05-12 NOTE — Telephone Encounter (Signed)
Called and let patient know what Dr. Sherie Don said. Patient stated she would try to cut back on her cigarette smoking.

## 2015-06-17 ENCOUNTER — Ambulatory Visit (INDEPENDENT_AMBULATORY_CARE_PROVIDER_SITE_OTHER): Payer: Commercial Managed Care - HMO | Admitting: Nurse Practitioner

## 2015-06-17 ENCOUNTER — Encounter: Payer: Self-pay | Admitting: Nurse Practitioner

## 2015-06-17 DIAGNOSIS — I10 Essential (primary) hypertension: Secondary | ICD-10-CM | POA: Diagnosis not present

## 2015-06-17 DIAGNOSIS — I340 Nonrheumatic mitral (valve) insufficiency: Secondary | ICD-10-CM | POA: Diagnosis not present

## 2015-06-17 NOTE — Progress Notes (Signed)
Patient Name: Meghan CombesDonna Dunn Westcott Date of Encounter: 06/17/2015  Primary Care Provider:  Baruch GoutyMelinda Lada, MD Primary Cardiologist:  Concha Se. Gollan, MD   Chief Complaint  64 y/o female with a h/o HTN, orthostasis, tob/etoh abuse, who presents for f/u.  Past Medical History   Past Medical History  Diagnosis Date  . Essential hypertension   . Hyperlipidemia   . Anxiety   . Depression   . Osteoporosis   . Hypothyroidism   . Hyponatremia   . Mild Mitral and Tricuspid Regurgitation     a. 08/2014 Echo: EF 60-65%, mild MR/TR.  Marland Kitchen. Elevated liver enzymes   . Mild reactive airways disease   . Tobacco use   . Macrocytosis   . Excessive drinking of alcohol   . Vulvar intraepithelial neoplasia II   . Orthostasis     a. 08/2014->diuretics held.   Past Surgical History  Procedure Laterality Date  . Appendectomy    . Breast enhancement surgery    . Septoplasty    . Abdominal hysterectomy      complete, dermoid tumor left ovary    Allergies  Allergies  Allergen Reactions  . Benazepril   . Erythromycin     Mouth pilled.     HPI  64 year old female with a history of hypertension, tobacco, alcohol abuse. She was last seen by Dr. Mariah MillingGollan earlier this year secondary to orthostasis in the setting of diuretic therapy. Diuretic was discontinued and she has since had no recurrence of orthostasis. Since her last visit, she has reduced her smoking and is now down to half a pack a day. She has also cut back on her alcohol consumption and is currently drinking 2-3 beers most days of the week. She recently started practicing yoga and has been enjoying it. She denies any chest pain, PND, orthopnea, dizziness, syncope, edema, or early satiety.  Home Medications  Prior to Admission medications   Medication Sig Start Date End Date Taking? Authorizing Provider  albuterol (PROVENTIL HFA;VENTOLIN HFA) 108 (90 BASE) MCG/ACT inhaler Inhale into the lungs every 6 (six) hours as needed for wheezing or shortness  of breath.   Yes Historical Provider, MD  aspirin 81 MG tablet Take 81 mg by mouth daily.   Yes Historical Provider, MD  atorvastatin (LIPITOR) 20 MG tablet Take 20 mg by mouth every other day.    Yes Historical Provider, MD  busPIRone (BUSPAR) 10 MG tablet Take 1 tablet (10 mg total) by mouth 2 (two) times daily as needed. 05/06/15  Yes Kerman PasseyMelinda P Lada, MD  cyanocobalamin 500 MCG tablet Take 500 mcg by mouth 2 (two) times daily.   Yes Historical Provider, MD  folic acid (FOLVITE) 400 MCG tablet Take 400 mcg by mouth 2 (two) times daily.   Yes Historical Provider, MD  levothyroxine (SYNTHROID, LEVOTHROID) 75 MCG tablet Take 75 mcg by mouth daily before breakfast.   Yes Historical Provider, MD  metoprolol succinate (TOPROL-XL) 50 MG 24 hr tablet Take 1 tablet (50 mg total) by mouth daily. Take with or immediately following a meal. 09/17/14  Yes Antonieta Ibaimothy J Gollan, MD    Review of Systems  As above, she's been doing well.  She denies chest pain, palpitations, dyspnea, pnd, orthopnea, n, v, dizziness, syncope, edema, weight gain, or early satiety.  All other systems reviewed and are otherwise negative except as noted above.  Physical Exam  VS:  BP 122/66 mmHg  Pulse 68  Ht 5' 6.5" (1.689 m)  Wt 136 lb 8 oz (  61.916 kg)  BMI 21.70 kg/m2 , BMI Body mass index is 21.7 kg/(m^2). GEN: Well nourished, well developed, in no acute distress. HEENT: normal. Neck: Supple, no JVD, carotid bruits, or masses. Cardiac: RRR, no murmurs, rubs, or gallops. No clubbing, cyanosis, edema.  Radials/DP/PT 2+ and equal bilaterally.  Respiratory:  Respirations regular and unlabored, clear to auscultation bilaterally. GI: Soft, nontender, nondistended, BS + x 4. MS: no deformity or atrophy. Skin: warm and dry, no rash. Neuro:  Strength and sensation are intact. Psych: Normal affect.  Accessory Clinical Findings  ECG - reveals sinus rhythm, 68, left axis, no acute ST or T changes.  Assessment & Plan  1.    Essential hypertension with history of orthostasis: Stable on current regimen which includes beta blocker therapy. She is no longer on a diuretic and has not had recurrence of orthostasis.  2. Ongoing tobacco abuse: Complete cessation advice. I have recommended that she identify a date on the calendar, circle it, and plan to quit on that day. She is in pre-contemplation.  3. Ongoing alcohol abuse: She has cut back to 2-3 beers most days of the week. complete cessation advised.  4. Mild mitral and tricuspid regurgitation: This was noted at the time of her echocardiogram in Jan 2016. She is asymptomatic.  5. Disposition: Follow-up with Dr. Mariah Milling in 1 year or sooner if necessary.   Nicolasa Ducking, NP 06/17/2015, 2:27 PM

## 2015-06-17 NOTE — Patient Instructions (Signed)
Medication Instructions: - no changes  Labwork: - none  Procedures/Testing: - none  Follow-Up: - Your physician wants you to follow-up in: 1 year with Dr. Mariah MillingGollan. You will receive a reminder letter in the mail two months in advance. If you don't receive a letter, please call our office to schedule the follow-up appointment.  Any Additional Special Instructions Will Be Listed Below (If Applicable).

## 2015-06-18 ENCOUNTER — Ambulatory Visit (INDEPENDENT_AMBULATORY_CARE_PROVIDER_SITE_OTHER): Payer: Commercial Managed Care - HMO | Admitting: Obstetrics and Gynecology

## 2015-06-18 VITALS — BP 137/87 | HR 62 | Ht 66.6 in | Wt 135.2 lb

## 2015-06-18 DIAGNOSIS — Z716 Tobacco abuse counseling: Secondary | ICD-10-CM

## 2015-06-18 DIAGNOSIS — N890 Mild vaginal dysplasia: Secondary | ICD-10-CM

## 2015-06-18 DIAGNOSIS — D071 Carcinoma in situ of vulva: Secondary | ICD-10-CM

## 2015-06-18 DIAGNOSIS — Z720 Tobacco use: Secondary | ICD-10-CM

## 2015-06-20 ENCOUNTER — Encounter: Payer: Self-pay | Admitting: Obstetrics and Gynecology

## 2015-06-20 NOTE — Progress Notes (Signed)
GYNECOLOGY CLINIC COLPOSCOPY PROCEDURE NOTE  Meghan Rivers is a [age] y.o. 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  Discussed role for tobacco abuse, need for surveillance.  Patient given informed consent, signed copy in the chart, time out was performed.  Placed in lithotomy position. Vulva and vagina viewed with speculum and colposcope after application of acetic acid.   Colposcopy adequate? Yes No visible lesions and no abnormal vasculature; no biopsies obtained.     Patient was given post procedure instructions.  To return in 6 months for repeat colposcopy.  After that, can return to yearly routine surveillance.  Routine preventative health maintenance measures emphasized.  Discussed smoking cessation.  Patient desires to quit, set quit date to 08/25/2015. Is going to attempt to wean down.  Smokes slightly over 1/2 ppd currently. Advised on tips for smoking cessation, smoking hotlines, and medications.     Meghan LaserAnika Kaitrin Seybold, MD [facility]

## 2015-07-08 ENCOUNTER — Encounter: Payer: Self-pay | Admitting: Family Medicine

## 2015-07-08 ENCOUNTER — Ambulatory Visit (INDEPENDENT_AMBULATORY_CARE_PROVIDER_SITE_OTHER): Payer: Commercial Managed Care - HMO | Admitting: Family Medicine

## 2015-07-08 VITALS — BP 146/91 | HR 66 | Temp 98.5°F | Ht 66.75 in | Wt 135.0 lb

## 2015-07-08 DIAGNOSIS — Z1211 Encounter for screening for malignant neoplasm of colon: Secondary | ICD-10-CM | POA: Diagnosis not present

## 2015-07-08 DIAGNOSIS — Z720 Tobacco use: Secondary | ICD-10-CM | POA: Diagnosis not present

## 2015-07-08 DIAGNOSIS — Z0000 Encounter for general adult medical examination without abnormal findings: Secondary | ICD-10-CM | POA: Diagnosis not present

## 2015-07-08 DIAGNOSIS — Z1239 Encounter for other screening for malignant neoplasm of breast: Secondary | ICD-10-CM

## 2015-07-08 NOTE — Assessment & Plan Note (Addendum)
Smoker for 30+ years; order chest CT today; discussed screening guidelines; encouraged her to quit, but she is not ready

## 2015-07-08 NOTE — Patient Instructions (Addendum)
Please do call to schedule your mammogram; the number to schedule one at either [facility] or [facility] is [phone] You can have that done every 1-2 years now Do start taking 1000 iu vitamin D3 daily Meghan Rivers should contact you soon about the lung cancer screening test I still recommend you quit smoking Your next bone density test is due on or after April 25, 2016 Watch the place on your arm and let me know if getting thicker or worse, and we can refer you to a dermatologist Return in 12+ months for next medicare visit Return November 16, 2015 or just after for fasting labs and visit with Dr. Sanda Klein  Health Maintenance, Female Adopting a healthy lifestyle and getting preventive care can go a long way to promote health and wellness. Talk with your health care provider about what schedule of regular examinations is right for you. This is a good chance for you to check in with your provider about disease prevention and staying healthy. In between checkups, there are plenty of things you can do on your own. Experts have done a lot of research about which lifestyle changes and preventive measures are most likely to keep you healthy. Ask your health care provider for more information. WEIGHT AND DIET  Eat a healthy diet  Be sure to include plenty of vegetables, fruits, low-fat dairy products, and lean protein.  Do not eat a lot of foods high in solid fats, added sugars, or salt.  Get regular exercise. This is one of the most important things you can do for your health.  Most adults should exercise for at least 150 minutes each week. The exercise should increase your heart rate and make you sweat (moderate-intensity exercise).  Most adults should also do strengthening exercises at least twice a week. This is in addition to the moderate-intensity exercise.  Maintain a healthy weight  Body mass index (BMI) is a measurement that can be used to identify possible  weight problems. It estimates body fat based on height and weight. Your health care provider can help determine your BMI and help you achieve or maintain a healthy weight.  For females 34 years of age and older:   A BMI below 18.5 is considered underweight.  A BMI of 18.5 to 24.9 is normal.  A BMI of 25 to 29.9 is considered overweight.  A BMI of 30 and above is considered obese.  Watch levels of cholesterol and blood lipids  You should start having your blood tested for lipids and cholesterol at 66 years of age, then have this test every 5 years.  You may need to have your cholesterol levels checked more often if:  Your lipid or cholesterol levels are high.  You are older than 66 years of age.  You are at high risk for heart disease.  CANCER SCREENING   Lung Cancer  Lung cancer screening is recommended for adults 63-66 years old who are at high risk for lung cancer because of a history of smoking.  A yearly low-dose CT scan of the lungs is recommended for people who:  Currently smoke.  Have quit within the past 15 years.  Have at least a 30-pack-year history of smoking. A pack year is smoking an average of one pack of cigarettes a day for 1 year.  Yearly screening should continue until it has been 15 years since you quit.  Yearly screening should stop if you develop a health problem that would prevent you  from having lung cancer treatment.  Breast Cancer  Practice breast self-awareness. This means understanding how your breasts normally appear and feel.  It also means doing regular breast self-exams. Let your health care provider know about any changes, no matter how small.  If you are in your 20s or 30s, you should have a clinical breast exam (CBE) by a health care provider every 1-3 years as part of a regular health exam.  If you are 27 or older, have a CBE every year. Also consider having a breast X-ray (mammogram) every year.  If you have a family history of  breast cancer, talk to your health care provider about genetic screening.  If you are at high risk for breast cancer, talk to your health care provider about having an MRI and a mammogram every year.  Breast cancer gene (BRCA) assessment is recommended for women who have family members with BRCA-related cancers. BRCA-related cancers include:  Breast.  Ovarian.  Tubal.  Peritoneal cancers.  Results of the assessment will determine the need for genetic counseling and BRCA1 and BRCA2 testing. Cervical Cancer Your health care provider may recommend that you be screened regularly for cancer of the pelvic organs (ovaries, uterus, and vagina). This screening involves a pelvic examination, including checking for microscopic changes to the surface of your cervix (Pap test). You may be encouraged to have this screening done every 3 years, beginning at age 53.  For women ages 87-65, health care providers may recommend pelvic exams and Pap testing every 3 years, or they may recommend the Pap and pelvic exam, combined with testing for human papilloma virus (HPV), every 5 years. Some types of HPV increase your risk of cervical cancer. Testing for HPV may also be done on women of any age with unclear Pap test results.  Other health care providers may not recommend any screening for nonpregnant women who are considered low risk for pelvic cancer and who do not have symptoms. Ask your health care provider if a screening pelvic exam is right for you.  If you have had past treatment for cervical cancer or a condition that could lead to cancer, you need Pap tests and screening for cancer for at least 20 years after your treatment. If Pap tests have been discontinued, your risk factors (such as having a new sexual partner) need to be reassessed to determine if screening should resume. Some women have medical problems that increase the chance of getting cervical cancer. In these cases, your health care provider may  recommend more frequent screening and Pap tests. Colorectal Cancer  This type of cancer can be detected and often prevented.  Routine colorectal cancer screening usually begins at 66 years of age and continues through 66 years of age.  Your health care provider may recommend screening at an earlier age if you have risk factors for colon cancer.  Your health care provider may also recommend using home test kits to check for hidden blood in the stool.  A small camera at the end of a tube can be used to examine your colon directly (sigmoidoscopy or colonoscopy). This is done to check for the earliest forms of colorectal cancer.  Routine screening usually begins at age 18.  Direct examination of the colon should be repeated every 5-10 years through 66 years of age. However, you may need to be screened more often if early forms of precancerous polyps or small growths are found. Skin Cancer  Check your skin from head to toe  regularly.  Tell your health care provider about any new moles or changes in moles, especially if there is a change in a mole's shape or color.  Also tell your health care provider if you have a mole that is larger than the size of a pencil eraser.  Always use sunscreen. Apply sunscreen liberally and repeatedly throughout the day.  Protect yourself by wearing long sleeves, pants, a wide-brimmed hat, and sunglasses whenever you are outside. HEART DISEASE, DIABETES, AND HIGH BLOOD PRESSURE   High blood pressure causes heart disease and increases the risk of stroke. High blood pressure is more likely to develop in:  People who have blood pressure in the high end of the normal range (130-139/85-89 mm Hg).  People who are overweight or obese.  People who are African American.  If you are 94-18 years of age, have your blood pressure checked every 3-5 years. If you are 73 years of age or older, have your blood pressure checked every year. You should have your blood  pressure measured twice--once when you are at a hospital or clinic, and once when you are not at a hospital or clinic. Record the average of the two measurements. To check your blood pressure when you are not at a hospital or clinic, you can use:  An automated blood pressure machine at a pharmacy.  A home blood pressure monitor.  If you are between 46 years and 5 years old, ask your health care provider if you should take aspirin to prevent strokes.  Have regular diabetes screenings. This involves taking a blood sample to check your fasting blood sugar level.  If you are at a normal weight and have a low risk for diabetes, have this test once every three years after 66 years of age.  If you are overweight and have a high risk for diabetes, consider being tested at a younger age or more often. PREVENTING INFECTION  Hepatitis B  If you have a higher risk for hepatitis B, you should be screened for this virus. You are considered at high risk for hepatitis B if:  You were born in a country where hepatitis B is common. Ask your health care provider which countries are considered high risk.  Your parents were born in a high-risk country, and you have not been immunized against hepatitis B (hepatitis B vaccine).  You have HIV or AIDS.  You use needles to inject street drugs.  You live with someone who has hepatitis B.  You have had sex with someone who has hepatitis B.  You get hemodialysis treatment.  You take certain medicines for conditions, including cancer, organ transplantation, and autoimmune conditions. Hepatitis C  Blood testing is recommended for:  Everyone born from [birth year] through 1967.  Anyone with known risk factors for hepatitis C. Sexually transmitted infections (STIs)  You should be screened for sexually transmitted infections (STIs) including gonorrhea and chlamydia if:  You are sexually active and are younger than 66 years of age.  You are older than 65 years  of age and your health care provider tells you that you are at risk for this type of infection.  Your sexual activity has changed since you were last screened and you are at an increased risk for chlamydia or gonorrhea. Ask your health care provider if you are at risk.  If you do not have HIV, but are at risk, it may be recommended that you take a prescription medicine daily to prevent HIV infection. This is  called pre-exposure prophylaxis (PrEP). You are considered at risk if:  You are sexually active and do not regularly use condoms or know the HIV status of your partner(s).  You take drugs by injection.  You are sexually active with a partner who has HIV. Talk with your health care provider about whether you are at high risk of being infected with HIV. If you choose to begin PrEP, you should first be tested for HIV. You should then be tested every 3 months for as long as you are taking PrEP.  PREGNANCY   If you are premenopausal and you may become pregnant, ask your health care provider about preconception counseling.  If you may become pregnant, take 400 to 800 micrograms (mcg) of folic acid every day.  If you want to prevent pregnancy, talk to your health care provider about birth control (contraception). OSTEOPOROSIS AND MENOPAUSE   Osteoporosis is a disease in which the bones lose minerals and strength with aging. This can result in serious bone fractures. Your risk for osteoporosis can be identified using a bone density scan.  If you are 58 years of age or older, or if you are at risk for osteoporosis and fractures, ask your health care provider if you should be screened.  Ask your health care provider whether you should take a calcium or vitamin D supplement to lower your risk for osteoporosis.  Menopause may have certain physical symptoms and risks.  Hormone replacement therapy may reduce some of these symptoms and risks. Talk to your health care provider about whether hormone  replacement therapy is right for you.  HOME CARE INSTRUCTIONS   Schedule regular health, dental, and eye exams.  Stay current with your immunizations.   Do not use any tobacco products including cigarettes, chewing tobacco, or electronic cigarettes.  If you are pregnant, do not drink alcohol.  If you are breastfeeding, limit how much and how often you drink alcohol.  Limit alcohol intake to no more than 1 drink per day for nonpregnant women. One drink equals 12 ounces of beer, 5 ounces of wine, or 1 ounces of hard liquor.  Do not use street drugs.  Do not share needles.  Ask your health care provider for help if you need support or information about quitting drugs.  Tell your health care provider if you often feel depressed.  Tell your health care provider if you have ever been abused or do not feel safe at home.   This information is not intended to replace advice given to you by your health care provider. Make sure you discuss any questions you have with your health care provider.   Document Released: 02/07/2011 Document Revised: 08/15/2014 Document Reviewed: 06/26/2013 Elsevier Interactive Patient Education [employer].

## 2015-07-08 NOTE — Assessment & Plan Note (Signed)
She refuses colonoscopy but agrees to [employer]

## 2015-07-08 NOTE — Assessment & Plan Note (Signed)
Order entered; every 1-2 years

## 2015-07-08 NOTE — Assessment & Plan Note (Addendum)
USPSTF grade A and B recommendations reviewed with patient; age-appropriate recommendations, preventive care, screening tests, etc discussed and encouraged; healthy living encouraged; see AVS for patient education given to patient; advised one drink per day is guideline, hope she'll continue to cut down further

## 2015-07-08 NOTE — Progress Notes (Signed)
Patient: Meghan Rivers, Female    DOB: [birth year], 64 y.o.   MRN: [id number]  Visit Date: 07/08/2015  Today's Provider: Enid Derry, MD   Chief Complaint  Patient presents with  . Annual Exam    Medicare Annual wellness  . Referral    mammogram card given to patient to schedule appt.    Subjective:   Meghan Rivers is a 64 y.o. female who presents today for her Subsequent Annual Wellness Visit.  Caregiver input:  None  She refused flu vaccine, hepatitis C screening, colonoscopy, zostavax, and pneumonia vaccine Her tetanus is UTD  USPSTF grade A and B recommendations Alcohol: she has cut way back, two beers a day, she does not get drunk or heavy buzz Depression:  Depression screen [facility] 2/9 07/08/2015  Decreased Interest 0  Down, Depressed, Hopeless 0  PHQ - 2 Score 0   Hypertension: almost to goal; no pills today Obesity: stable, nonobese Tobacco use: cutting back to under 1/2 ppd HIV, hep B, hep C: patient declined Lipids: October 2016 Glucose: 98 in October 2016 Colorectal cancer: declined colonoscopy by patient but willing to do cologuard Breast cancer: order mammo today BRCA gene screening: mother had breast cancer, no other relatives; no ovarian cancer Intimate partner violence: no Cervical cancer screening: s/p hysterectomy in 2006; hx of abnormal cells, sees Dr. Marcelline Mates, had colposcopy a few months ago, fine says patient Lung cancer: chest CT ordered today Osteoporosis: DEXA due Sept 2017 Fall prevention/vitamin D: not taking now, but will start AAA: no family members Aspirin: 81 mg aspirin daily Diet: not a good eater; not much fast food, does not get hungry; sometimes cereal; sense of taste and smell are intact Exercise: did yoga for 3 weeks, then stopped b/c she had to drive; will find another place Skin cancer: no worrisome moles; had dry patch on arm that melaluca was helping; ran out of that and it came back up, she will watch and refer to derm if getting  worse; she will call  HPI  Review of Systems  Past Medical History  Diagnosis Date  . Essential hypertension   . Hyperlipidemia   . Anxiety   . Depression   . Osteoporosis   . Hypothyroidism   . Hyponatremia   . Mild Mitral and Tricuspid Regurgitation     a. 08/2014 Echo: EF 60-65%, mild MR/TR.  Marland Kitchen Elevated liver enzymes   . Mild reactive airways disease   . Tobacco use   . Macrocytosis   . Excessive drinking of alcohol   . Vulvar intraepithelial neoplasia II   . Orthostasis     a. 08/2014->diuretics held.    Past Surgical History  Procedure Laterality Date  . Appendectomy    . Breast enhancement surgery    . Septoplasty    . Abdominal hysterectomy      complete, dermoid tumor left ovary    Family History  Problem Relation Age of Onset  . Heart attack Father 69  . Hyperlipidemia Father   . Hypertension Father   . Heart disease Father   . Heart failure Father   . Stroke Father   . Kidney disease Father   . Emphysema Father   . COPD Father   . Heart disease Brother   . Heart attack Brother 71  . CAD Brother   . Cancer Mother     breast  . Diabetes Neg Hx     Social History   Social History  . Marital Status: Married  Spouse Name: N/A  . Number of Children: N/A  . Years of Education: N/A   Occupational History  . Not on file.   Social History Main Topics  . Smoking status: Current Every Day Smoker -- 0.25 packs/day for 40 years    Types: Cigarettes  . Smokeless tobacco: Never Used     Comment: anticipated quit date 08/25/2015  . Alcohol Use: 6.0 oz/week    3 Cans of beer, 7 Shots of liquor per week  . Drug Use: No  . Sexual Activity: No   Other Topics Concern  . Not on file   Social History Narrative    Outpatient Encounter Prescriptions as of 07/08/2015  Medication Sig  . albuterol (PROVENTIL HFA;VENTOLIN HFA) 108 (90 BASE) MCG/ACT inhaler Inhale into the lungs every 6 (six) hours as needed for wheezing or shortness of breath.  Marland Kitchen  aspirin 81 MG tablet Take 81 mg by mouth daily.  Marland Kitchen atorvastatin (LIPITOR) 20 MG tablet Take 20 mg by mouth every other day.   . busPIRone (BUSPAR) 10 MG tablet Take 1 tablet (10 mg total) by mouth 2 (two) times daily as needed.  . cyanocobalamin 500 MCG tablet Take 500 mcg by mouth 2 (two) times daily.  . folic acid (FOLVITE) 292 MCG tablet Take 400 mcg by mouth 2 (two) times daily.  Marland Kitchen levothyroxine (SYNTHROID, LEVOTHROID) 75 MCG tablet Take 75 mcg by mouth daily before breakfast.  . metoprolol succinate (TOPROL-XL) 50 MG 24 hr tablet Take 1 tablet (50 mg total) by mouth daily. Take with or immediately following a meal.   No facility-administered encounter medications on file as of 07/08/2015.    Functional Ability / Safety Screening 1.  Was the timed Get Up and Go test longer than 30 seconds?  no 2.  Does the patient need help with the phone, transportation, shopping,      preparing meals, housework, laundry, medications, or managing money?  no 3.  Does the patient's home have:  loose throw rugs in the hallway?   no      Grab bars in the bathroom? no      Handrails on the stairs?   no no stairs      Poor lighting?   no 4.  Has the patient noticed any hearing difficulties?   no  Fall Risk Assessment See under rooming  Depression Screen See under rooming Depression screen [facility] 2/9 07/08/2015  Decreased Interest 0  Down, Depressed, Hopeless 0  PHQ - 2 Score 0    Advanced Directives Does patient have a HCPOA?    yes If yes, name and contact information: Devoria Albe, cell phone [phone] Does patient have a living will or MOST form?  yes, maybe, she will look Full code  Objective:   Vitals: BP 146/91 mmHg  Pulse 66  Temp(Src) 98.5 F (36.9 C)  Ht 5' 6.75" (1.695 m)  Wt 135 lb (61.236 kg)  BMI 21.31 kg/m2  SpO2 99% Body mass index is 21.31 kg/(m^2).  Visual Acuity Screening   Right eye Left eye Both eyes  Without correction:     With correction: 20/20 20/25    Wt  Readings from Last 3 Encounters:  07/08/15 135 lb (61.236 kg)  06/18/15 135 lb 4 oz (61.349 kg)  06/17/15 136 lb 8 oz (61.916 kg)    Physical Exam  Constitutional: She appears well-developed and well-nourished.  Weight stable; smell of cigarette smoke present upon entering room  Skin: Lesion (keratotic whitish lesion sun exposed  left forearm, very minimal induration) noted.  Psychiatric: She has a normal mood and affect.   Mood/affect:  euthymic Appearance:  casual  Cognitive Testing - 6-CIT  Correct? Score   What year is it? yes 0 Yes = 0    No = 4  What month is it? yes 0 Yes = 0    No = 3  Remember:     [address]ia [address], [place], [place]     What time is it? yes 0 Yes = 0    No = 3  Count backwards from 20 to 1 yes 0 Correct = 0    1 error = 2   More than 1 error = 4  Say the months of the year in reverse. yes 0 Correct = 0    1 error = 2   More than 1 error = 4  What address did I ask you to remember? no 2 Correct = 0  1 error = 2    2 error = 4    3 error = 6    4 error = 8    All wrong = 10       TOTAL SCORE  2/28   Interpretation:  Normal  Normal (0-7) Abnormal (8-28)    Assessment & Plan:     Annual Wellness Visit  Reviewed patient's Family Medical History Reviewed and updated list of patient's medical providers Assessment of cognitive impairment was done Assessed patient's functional ability Established a written schedule for health screening [place] Completed and Reviewed  Exercise Activities and Dietary recommendations Goals    None      Immunization History  Administered Date(s) Administered  . Td 08/13/2007    Health Maintenance  Topic Date Due  . INFLUENZA VACCINE  11/06/2015 (Originally 03/09/2015)  . Hepatitis C Screening  05/10/2016 (Originally 06/02/1951)  . COLONOSCOPY  07/07/2016 (Originally 05/28/2015)  . ZOSTAVAX  07/07/2016 (Originally 05/22/2009)  . PNA vac Low Risk Adult (1 of 2 - PCV13) 07/07/2016 (Originally  05/22/2014)  . MAMMOGRAM  04/25/2016  . TETANUS/TDAP  08/12/2017  . DEXA SCAN  Completed     Discussed health benefits of physical activity, and encouraged her to engage in regular exercise appropriate for her age and condition.   No orders of the defined types were placed in this encounter.    Current outpatient prescriptions:  .  albuterol (PROVENTIL HFA;VENTOLIN HFA) 108 (90 BASE) MCG/ACT inhaler, Inhale into the lungs every 6 (six) hours as needed for wheezing or shortness of breath., Disp: , Rfl:  .  aspirin 81 MG tablet, Take 81 mg by mouth daily., Disp: , Rfl:  .  atorvastatin (LIPITOR) 20 MG tablet, Take 20 mg by mouth every other day. , Disp: , Rfl:  .  busPIRone (BUSPAR) 10 MG tablet, Take 1 tablet (10 mg total) by mouth 2 (two) times daily as needed., Disp: 60 tablet, Rfl: 2 .  cyanocobalamin 500 MCG tablet, Take 500 mcg by mouth 2 (two) times daily., Disp: , Rfl:  .  folic acid (FOLVITE) 212 MCG tablet, Take 400 mcg by mouth 2 (two) times daily., Disp: , Rfl:  .  levothyroxine (SYNTHROID, LEVOTHROID) 75 MCG tablet, Take 75 mcg by mouth daily before breakfast., Disp: , Rfl:  .  metoprolol succinate (TOPROL-XL) 50 MG 24 hr tablet, Take 1 tablet (50 mg total) by mouth daily. Take with or immediately following a meal., Disp: 30 tablet, Rfl: 11 There are no discontinued  medications.  Next Medicare Wellness Visit in 12+ months  An after-visit summary was printed and given to the patient at [facility].  Please see the patient instructions which may contain other information and recommendations beyond what is mentioned above in the assessment and plan.  Problem List Items Addressed This Visit      Other   Tobacco use    Smoker for 30+ years; order chest CT today; discussed screening guidelines; encouraged her to quit, but she is not ready      Relevant Orders   CT CHEST NODULE FOLLOW UP LOW DOSE W/O   Breast cancer screening    Order entered; every 1-2 years      Relevant  Orders   MM DIGITAL SCREENING BILATERAL   Preventative health care - Primary    USPSTF grade A and B recommendations reviewed with patient; age-appropriate recommendations, preventive care, screening tests, etc discussed and encouraged; healthy living encouraged; see AVS for patient education given to patient; advised one drink per day is guideline, hope she'll continue to cut down further      Colon cancer screening    She refuses colonoscopy but agrees to Cologuard      Relevant Orders   Cologuard

## 2015-07-10 ENCOUNTER — Encounter: Payer: Self-pay | Admitting: Family Medicine

## 2015-07-10 ENCOUNTER — Ambulatory Visit
Admission: RE | Admit: 2015-07-10 | Discharge: 2015-07-10 | Disposition: A | Discharge status: Home / Self Care | Payer: Commercial Managed Care - HMO | Source: Ambulatory Visit | Attending: Family Medicine | Admitting: Family Medicine

## 2015-07-10 ENCOUNTER — Other Ambulatory Visit: Payer: Self-pay | Admitting: Family Medicine

## 2015-07-10 ENCOUNTER — Inpatient Hospital Stay: Payer: Commercial Managed Care - HMO | Attending: Family Medicine | Admitting: Family Medicine

## 2015-07-10 DIAGNOSIS — Z122 Encounter for screening for malignant neoplasm of respiratory organs: Secondary | ICD-10-CM | POA: Diagnosis not present

## 2015-07-10 DIAGNOSIS — Z87891 Personal history of nicotine dependence: Secondary | ICD-10-CM

## 2015-07-10 DIAGNOSIS — I2510 Atherosclerotic heart disease of native coronary artery without angina pectoris: Secondary | ICD-10-CM | POA: Diagnosis not present

## 2015-07-10 HISTORY — DX: Personal history of nicotine dependence: Z87.891

## 2015-07-10 NOTE — Progress Notes (Signed)
In accordance with CMS guidelines, patient has meet eligibility criteria including age, absence of signs or symptoms of lung cancer, the specific calculation of cigarette smoking pack-years was 40 years and is a current smoker.   A shared decision-making session was conducted prior to the performance of CT scan. This includes one or more decision aids, includes benefits and harms of screening, follow-up diagnostic testing, over-diagnosis, false positive rate, and total radiation exposure.  Counseling on the importance of adherence to annual lung cancer LDCT screening, impact of co-morbidities, and ability or willingness to undergo diagnosis and treatment is imperative for compliance of the program.  Counseling on the importance of continued smoking cessation for former smokers; the importance of smoking cessation for current smokers and information about tobacco cessation interventions have been given to patient including the [place] at [facility], [employer] quit [employer], as well as Cancer Center specific smoking cessation programs.  Written order for lung cancer screening with LDCT has been given to the patient and any and all questions have been answered to the best of my abilities.   Yearly follow up will be scheduled by Shawn Perkins, Thoracic Navigator.   

## 2015-07-13 ENCOUNTER — Telehealth: Payer: Self-pay | Admitting: *Deleted

## 2015-07-13 NOTE — Telephone Encounter (Signed)
Notified patient of LDCT lung cancer screening results of Lung Rads 2 finding with recommendation for 12 month follow up imaging. Also notified of incidental finding noted below and that findings will be forwarded to Dr. Sherie DonLada. Patient verbalizes understanding.   IMPRESSION: 1. Lung-RADS Category 2, benign appearance or behavior. Continue annual screening with low-dose chest CT without contrast in 12 months. 2. Age advanced coronary artery atherosclerosis. Recommend assessment of coronary risk factors and consideration of medical therapy. 3. Left hepatic lobe low-density lesion is likely a cyst. An incompletely imaged right hepatic lobe low-density lesion is indeterminate, but most likely a cyst. Presuming no history of primary malignancy, consider initial evaluation with focused right upper quadrant ultrasound. If any history of primary malignancy or liver disease, consider dedicated pre and post contrast abdominal MRI.

## 2015-07-16 ENCOUNTER — Telehealth: Payer: Self-pay | Admitting: Family Medicine

## 2015-07-16 DIAGNOSIS — K769 Liver disease, unspecified: Secondary | ICD-10-CM | POA: Insufficient documentation

## 2015-07-16 DIAGNOSIS — I2510 Atherosclerotic heart disease of native coronary artery without angina pectoris: Secondary | ICD-10-CM

## 2015-07-16 NOTE — Assessment & Plan Note (Signed)
Discussed with patient; this is a new diagnosis in our problem list; I explained goal LDL less than 70; her last LDL was 63 and that was on statin every OTHER day; smoking can make this worse; I will notify her cardiologist about this new finding on CT

## 2015-07-16 NOTE — Telephone Encounter (Signed)
I talked to patient about the chest CT done for lung cancer screening; next CT scan in one year to f/u multiple pulm nodules; she already talked to staff at cancer center I explained liver lesions; need liver US; she has no hx of cancer so will not get MRI I also explained CAD, advanced for age, on CT scan; she just saw cardiologist in November; I will contact him to notify him of new finding on report Discussed lipid panel with patient; goal LDL under 70; her last LDL was 63 on every other day statin; smoking makes this worse; advised less sat fats -------------- Message sent to Dr. Mariah MillingGollan

## 2015-07-16 NOTE — Assessment & Plan Note (Signed)
Will get liver US; discussed CT findings with patient by phone

## 2015-07-17 ENCOUNTER — Telehealth: Payer: Self-pay | Admitting: Family Medicine

## 2015-07-17 NOTE — Telephone Encounter (Signed)
-----   Message from Antonieta Ibaimothy J Gollan, MD sent at 07/16/2015  6:37 PM EST ----- Regarding: RE: New diagnosis of CAD on chest CT; wanted you to see this I'm happy to see her, we will call her to make an appointment.  I did look at her CT scan images, she has PAD extending up into the carotids, aorta, down the descending aorta. Reasonable amount of coronary artery disease, mildly dilated aortic root. Happy to try to put it altogether thx Tim G  ----- Message -----    From: Kerman PasseyMelinda P Zyana Amaro, MD    Sent: 07/16/2015   1:24 PM      To: Antonieta Ibaimothy J Gollan, MD Subject: New diagnosis of CAD on chest CT; wanted you#  Greetings! I hope you are well.  I just reviewed chest CT results with patient (done for lung cancer screening). We found coronary artery disease, wanted you to see. Her last LDL was 63 (I told her goal LDL under 70). Thanks!

## 2015-07-21 ENCOUNTER — Encounter: Payer: Self-pay | Admitting: Cardiovascular Disease

## 2015-07-21 ENCOUNTER — Ambulatory Visit (INDEPENDENT_AMBULATORY_CARE_PROVIDER_SITE_OTHER): Payer: Commercial Managed Care - HMO | Admitting: Cardiovascular Disease

## 2015-07-21 VITALS — BP 130/90 | HR 81 | Ht 66.5 in | Wt 138.0 lb

## 2015-07-21 DIAGNOSIS — F17200 Nicotine dependence, unspecified, uncomplicated: Secondary | ICD-10-CM

## 2015-07-21 DIAGNOSIS — I6523 Occlusion and stenosis of bilateral carotid arteries: Secondary | ICD-10-CM

## 2015-07-21 DIAGNOSIS — E785 Hyperlipidemia, unspecified: Secondary | ICD-10-CM | POA: Diagnosis not present

## 2015-07-21 DIAGNOSIS — I2510 Atherosclerotic heart disease of native coronary artery without angina pectoris: Secondary | ICD-10-CM

## 2015-07-21 DIAGNOSIS — I6529 Occlusion and stenosis of unspecified carotid artery: Secondary | ICD-10-CM | POA: Insufficient documentation

## 2015-07-21 DIAGNOSIS — Z720 Tobacco use: Secondary | ICD-10-CM | POA: Diagnosis not present

## 2015-07-21 DIAGNOSIS — F1010 Alcohol abuse, uncomplicated: Secondary | ICD-10-CM

## 2015-07-21 DIAGNOSIS — I700 Atherosclerosis of aorta: Secondary | ICD-10-CM | POA: Insufficient documentation

## 2015-07-21 MED ORDER — VARENICLINE TARTRATE 1 MG PO TABS: 1.0000 mg | ORAL_TABLET | Freq: Two times a day (BID) | ORAL | Status: DC

## 2015-07-21 NOTE — Patient Instructions (Addendum)
You are doing well.  Try to quit smoking We will start chantix  1/2 pill daily for a week The 1/2 pill twice a day for a week Then 1 pill in the Am, 1/2 pill in the PM for one week Then 1 pill twice a day  Call when you would like a stress test You need to stop smoking for 24 hours  Please call us if you have new issues that need to be addressed before your next appt.  Your physician wants you to follow-up in: 6 months.  You will receive a reminder letter in the mail two months in advance. If you don't receive a letter, please call our office to schedule the follow-up appointment.  Steps to Quit Smoking  Smoking tobacco can be harmful to your health and can affect almost every organ in your body. Smoking puts you, and those around you, at risk for developing many serious chronic diseases. Quitting smoking is difficult, but it is one of the best things that you can do for your health. It is never too late to quit. WHAT ARE THE BENEFITS OF QUITTING SMOKING? When you quit smoking, you lower your risk of developing serious diseases and conditions, such as:  Lung cancer or lung disease, such as COPD.  Heart disease.  Stroke.  Heart attack.  Infertility.  Osteoporosis and bone fractures. Additionally, symptoms such as coughing, wheezing, and shortness of breath may get better when you quit. You may also find that you get sick less often because your body is stronger at fighting off colds and infections. If you are pregnant, quitting smoking can help to reduce your chances of having a baby of low birth weight. HOW DO I GET READY TO QUIT? When you decide to quit smoking, create a plan to make sure that you are successful. Before you quit:  Pick a date to quit. Set a date within the next two weeks to give you time to prepare.  Write down the reasons why you are quitting. Keep this list in places where you will see it often, such as on your bathroom mirror or in your car or  wallet.  Identify the people, places, things, and activities that make you want to smoke (triggers) and avoid them. Make sure to take these actions:  Throw away all cigarettes at home, at work, and in your car.  Throw away smoking accessories, such as Set designer.  Clean your car and make sure to empty the ashtray.  Clean your home, including curtains and carpets.  Tell your family, friends, and coworkers that you are quitting. Support from your loved ones can make quitting easier.  Talk with your health care provider about your options for quitting smoking.  Find out what treatment options are covered by your health insurance. WHAT STRATEGIES CAN I USE TO QUIT SMOKING?  Talk with your healthcare provider about different strategies to quit smoking. Some strategies include:  Quitting smoking altogether instead of gradually lessening how much you smoke over a period of time. Research shows that quitting "cold [place]" is more successful than gradually quitting.  Attending in-person counseling to help you build problem-solving skills. You are more likely to have success in quitting if you attend several counseling sessions. Even short sessions of 10 minutes can be effective.  Finding resources and support systems that can help you to quit smoking and remain smoke-free after you quit. These resources are most helpful when you use them often. They can include:  Online chats with a Veterinary surgeon.  Telephone quitlines.  Printed Materials engineer.  Support groups or group counseling.  Text messaging programs.  Mobile phone applications.  Taking medicines to help you quit smoking. (If you are pregnant or breastfeeding, talk with your health care provider first.) Some medicines contain nicotine and some do not. Both types of medicines help with cravings, but the medicines that include nicotine help to relieve withdrawal symptoms. Your health care provider may recommend:  Nicotine  patches, gum, or lozenges.  Nicotine inhalers or sprays.  Non-nicotine medicine that is taken by mouth. Talk with your health care provider about combining strategies, such as taking medicines while you are also receiving in-person counseling. Using these two strategies together makes you more likely to succeed in quitting than if you used either strategy on its own. If you are pregnant or breastfeeding, talk with your health care provider about finding counseling or other support strategies to quit smoking. Do not take medicine to help you quit smoking unless told to do so by your health care provider. WHAT THINGS CAN I DO TO MAKE IT EASIER TO QUIT? Quitting smoking might feel overwhelming at first, but there is a lot that you can do to make it easier. Take these important actions:  Reach out to your family and friends and ask that they support and encourage you during this time. Call telephone quitlines, reach out to support groups, or work with a counselor for support.  Ask people who smoke to avoid smoking around you.  Avoid places that trigger you to smoke, such as bars, parties, or smoke-break areas at work.  Spend time around people who do not smoke.  Lessen stress in your life, because stress can be a smoking trigger for some people. To lessen stress, try:  Exercising regularly.  Deep-breathing exercises.  Yoga.  Meditating.  Performing a body scan. This involves closing your eyes, scanning your body from head to toe, and noticing which parts of your body are particularly tense. Purposefully relax the muscles in those areas.  Download or purchase mobile phone or tablet apps (applications) that can help you stick to your quit plan by providing reminders, tips, and encouragement. There are many free apps, such as QuitGuide from the [employer] developer for Disease Control and Prevention). You can find other support for quitting smoking (smoking cessation) through smokefree.gov and other  websites. HOW WILL I FEEL WHEN I QUIT SMOKING? Within the first 24 hours of quitting smoking, you may start to feel some withdrawal symptoms. These symptoms are usually most noticeable 2-3 days after quitting, but they usually do not last beyond 2-3 weeks. Changes or symptoms that you might experience include:  Mood swings.  Restlessness, anxiety, or irritation.  Difficulty concentrating.  Dizziness.  Strong cravings for sugary foods in addition to nicotine.  Mild weight gain.  Constipation.  Nausea.  Coughing or a sore throat.  Changes in how your medicines work in your body.  A depressed mood.  Difficulty sleeping (insomnia). After the first 2-3 weeks of quitting, you may start to notice more positive results, such as:  Improved sense of smell and taste.  Decreased coughing and sore throat.  Slower heart rate.  Lower blood pressure.  Clearer skin.  The ability to breathe more easily.  Fewer sick days. Quitting smoking is very challenging for most people. Do not get discouraged if you are not successful the first time. Some people need to make many attempts to quit before they achieve long-term  success. Do your best to stick to your quit plan, and talk with your health care provider if you have any questions or concerns.   This information is not intended to replace advice given to you by your health care provider. Make sure you discuss any questions you have with your health care provider.   Document Released: 07/19/2001 Document Revised: 12/09/2014 Document Reviewed: 12/09/2014 Elsevier Interactive Patient Education 2016 [employer] Smoking Hazards Smoking cigarettes is extremely bad for your health. Tobacco smoke has over 200 known poisons in it. It contains the poisonous gases nitrogen oxide and carbon monoxide. There are over 60 chemicals in tobacco smoke that cause cancer. Some of the chemicals found in cigarette smoke include:   Cyanide.   Benzene.    Formaldehyde.   Methanol (wood alcohol).   Acetylene (fuel used in welding torches).   Ammonia.  Even smoking lightly shortens your life expectancy by several years. You can greatly reduce the risk of medical problems for you and your family by stopping now. Smoking is the most preventable cause of death and disease in our society. Within days of quitting smoking, your circulation improves, you decrease the risk of having a heart attack, and your lung capacity improves. There may be some increased phlegm in the first few days after quitting, and it may take months for your lungs to clear up completely. Quitting for 10 years reduces your risk of developing lung cancer to almost that of a nonsmoker.  WHAT ARE THE RISKS OF SMOKING? Cigarette smokers have an increased risk of many serious medical problems, including:  Lung cancer.   Lung disease (such as pneumonia, bronchitis, and emphysema).   Heart attack and chest pain due to the heart not getting enough oxygen (angina).   Heart disease and peripheral blood vessel disease.   Hypertension.   Stroke.   Oral cancer (cancer of the lip, mouth, or voice box).   Bladder cancer.   Pancreatic cancer.   Cervical cancer.   Pregnancy complications, including premature birth.   Stillbirths and smaller newborn babies, birth defects, and genetic damage to sperm.   Early menopause.   Lower estrogen level for women.   Infertility.   Facial wrinkles.   Blindness.   Increased risk of broken bones (fractures).   Senile dementia.   Stomach ulcers and internal bleeding.   Delayed wound healing and increased risk of complications during surgery. Because of secondhand smoke exposure, children of smokers have an increased risk of the following:   Sudden infant death syndrome (SIDS).   Respiratory infections.   Lung cancer.   Heart disease.   Ear infections.  WHY IS SMOKING ADDICTIVE? Nicotine is  the chemical agent in tobacco that is capable of causing addiction or dependence. When you smoke and inhale, nicotine is absorbed rapidly into the bloodstream through your lungs. Both inhaled and noninhaled nicotine may be addictive.  WHAT ARE THE BENEFITS OF QUITTING?  There are many health benefits to quitting smoking. Some are:   The likelihood of developing cancer and heart disease decreases. Health improvements are seen almost immediately.   Blood pressure, pulse rate, and breathing patterns start returning to normal soon after quitting.   People who quit may see an improvement in their overall quality of life.  HOW DO YOU QUIT SMOKING? Smoking is an addiction with both physical and psychological effects, and longtime habits can be hard to change. Your health care provider can recommend:  Programs and community resources, which may include group  support, education, or therapy.  Replacement products, such as patches, gum, and nasal sprays. Use these products only as directed. Do not replace cigarette smoking with electronic cigarettes (commonly called e-cigarettes). The safety of e-cigarettes is unknown, and some may contain harmful chemicals. FOR MORE INFORMATION  American Lung Association: www.lung.org  American Cancer Society: www.cancer.org   This information is not intended to replace advice given to you by your health care provider. Make sure you discuss any questions you have with your health care provider.   Document Released: 09/01/2004 Document Revised: 05/15/2013 Document Reviewed: 01/14/2013 Elsevier Interactive Patient Education 2016 [employer]. Secondhand Smoke WHAT IS SECONDHAND SMOKE? Secondhand smoke is smoke that comes from burning tobacco. It could be the smoke from a cigarette, a pipe, or a cigar. Even if you are not the one smoking, secondhand smoke exposes you to the dangers of smoking. This is called involuntary, or passive, smoking. There are two types of  secondhand smoke:  Sidestream smoke is the smoke that comes off the lighted end of a cigarette, pipe, or cigar.  This type of smoke has the highest amount of cancer-causing agents (carcinogens).  The particles in sidestream smoke are smaller. They get into your lungs more easily.  Mainstream smoke is the smoke that is exhaled by a person who is smoking.  This type of smoke is also dangerous to your health. HOW CAN SECONDHAND SMOKE AFFECT MY HEALTH? Studies show that there is no safe level of secondhand smoke. This smoke contains thousands of chemicals. At least 69 of them are known to cause cancer. Secondhand smoke can also cause many other health problems. It has been linked to:  Lung cancer.  Cancer of the voice box (larynx) or throat.  Cancer of the sinuses.  Brain cancer.  Bladder cancer.  Stomach cancer.  Breast cancer.  White blood cell cancers (lymphoma and leukemia).  Brain and liver tumors in children.  Heart disease and stroke in adults.  Pregnancy loss (miscarriage).  Diseases in children, such as:  Asthma.  Lung infections.  Ear infections.  Sudden infant death syndrome (SIDS).  Slow growth. WHERE CAN I BE AT RISK FOR EXPOSURE TO SECONDHAND SMOKE?   For adults, the workplace is the main source of exposure to secondhand smoke.  Your workplace should have a policy separating smoking areas from nonsmoking areas.  Smoking areas should have a system for ventilating and cleaning the air.  For children, the home may be the most dangerous place for exposure to secondhand smoke.  Children who live in apartment buildings may be at risk from smoke drifting from hallways or other people's homes.  For everyone, many public places are possible sources of exposure to secondhand smoke.  These places include restaurants, shopping centers, and parks. HOW CAN I REDUCE MY RISK FOR EXPOSURE TO SECONDHAND SMOKE? The most important thing you can do is not smoke.  Discourage family members from smoking. Other ways to reduce exposure for you and your family include the following:  Keep your home smoke free.  Make sure your child care providers do not smoke.  Warn your child about the dangers of smoking and secondhand smoke.  Do not allow smoking in your car. When someone smokes in a car, all the damaging chemicals from the smoke are confined in a small area.  Avoid public places where smoking is allowed.   This information is not intended to replace advice given to you by your health care provider. Make sure you discuss any  questions you have with your health care provider.   Document Released: 09/01/2004 Document Revised: 08/15/2014 Document Reviewed: 11/08/2013 Elsevier Interactive Patient Education 2016 [employer]. Varenicline oral tablets What is this medicine? VARENICLINE (var EN i kleen) is used to help people quit smoking. It can reduce the symptoms caused by stopping smoking. It is used with a patient support program recommended by your physician. This medicine may be used for other purposes; ask your health care provider or pharmacist if you have questions. What should I tell my health care provider before I take this medicine? They need to know if you have any of these conditions: -bipolar disorder, depression, schizophrenia or other mental illness -heart disease -if you often drink alcohol -kidney disease -peripheral vascular disease -seizures -stroke -suicidal thoughts, plans, or attempt; a previous suicide attempt by you or a family member -an unusual or allergic reaction to varenicline, other medicines, foods, dyes, or preservatives -pregnant or trying to get pregnant -breast-feeding How should I use this medicine? Take this medicine by mouth after eating. Take with a full glass of water. Follow the directions on the prescription label. Take your doses at regular intervals. Do not take your medicine more often than  directed. There are 3 ways you can use this medicine to help you quit smoking; talk to your health care professional to decide which plan is right for you: 1) you can choose a quit date and start this medicine 1 week before the quit date, or, 2) you can start taking this medicine before you choose a quit date, and then pick a quit date between day 8 and 35 days of treatment, or, 3) if you are not sure that you are able or willing to quit smoking right away, start taking this medicine and slowly decrease the amount you smoke as directed by your health care professional with the goal of being cigarette-free by week 12 of treatment. Stick to your plan; ask about support groups or other ways to help you remain cigarette-free. If you are motivated to quit smoking and did not succeed during a previous attempt with this medicine for reasons other than side effects, or if you returned to smoking after this treatment, speak with your health care professional about whether another course of this medicine may be right for you. A special MedGuide will be given to you by the pharmacist with each prescription and refill. Be sure to read this information carefully each time. Talk to your pediatrician regarding the use of this medicine in children. This medicine is not approved for use in children. Overdosage: If you think you have taken too much of this medicine contact a poison control center or emergency room at once. NOTE: This medicine is only for you. Do not share this medicine with others. What if I miss a dose? If you miss a dose, take it as soon as you can. If it is almost time for your next dose, take only that dose. Do not take double or extra doses. What may interact with this medicine? -alcohol or any product that contains alcohol -insulin -other stop smoking aids -theophylline -warfarin This list may not describe all possible interactions. Give your health care provider a list of all the medicines,  herbs, non-prescription drugs, or dietary supplements you use. Also tell them if you smoke, drink alcohol, or use illegal drugs. Some items may interact with your medicine. What should I watch for while using this medicine? Visit your doctor or health care professional for  regular check ups. Ask for ongoing advice and encouragement from your doctor or healthcare professional, friends, and family to help you quit. If you smoke while on this medication, quit again Your mouth may get dry. Chewing sugarless gum or sucking hard candy, and drinking plenty of water may help. Contact your doctor if the problem does not go away or is severe. You may get drowsy or dizzy. Do not drive, use machinery, or do anything that needs mental alertness until you know how this medicine affects you. Do not stand or sit up quickly, especially if you are an older patient. This reduces the risk of dizzy or fainting spells. Sleepwalking can happen during treatment with this medicine, and can sometimes lead to behavior that is harmful to you, other people, or property. Stop taking this medicine and tell your doctor if you start sleepwalking or have other unusual sleep-related activity. Decrease the amount of alcoholic beverages that you drink during treatment with this medicine until you know if this medicine affects your ability to tolerate alcohol. Some people have experienced increased drunkenness (intoxication), unusual or sometimes aggressive behavior, or no memory of things that have happened (amnesia) during treatment with this medicine. The use of this medicine may increase the chance of suicidal thoughts or actions. Pay special attention to how you are responding while on this medicine. Any worsening of mood, or thoughts of suicide or dying should be reported to your health care professional right away. What side effects may I notice from receiving this medicine? Side effects that you should report to your doctor or health  care professional as soon as possible: -allergic reactions like skin rash, itching or hives, swelling of the face, lips, tongue, or throat -acting aggressive, being angry or violent, or acting on dangerous impulses -breathing problems -changes in vision -chest pain or chest tightness -confusion, trouble speaking or understanding -new or worsening depression, anxiety, or panic attacks -extreme increase in activity and talking (mania) -fast, irregular heartbeat -feeling faint or lightheaded, falls -fever -pain in legs when walking -problems with balance, talking, walking -redness, blistering, peeling or loosening of the skin, including inside the mouth -ringing in ears -seeing or hearing things that aren't there (hallucinations) -seizures -sleepwalking -sudden numbness or weakness of the face, arm or leg -thoughts about suicide or dying, or attempts to commit suicide -trouble passing urine or change in the amount of urine -unusual bleeding or bruising -unusually weak or tired Side effects that usually do not require medical attention (report to your doctor or health care professional if they continue or are bothersome): -constipation -headache -nausea, vomiting -strange dreams -stomach gas -trouble sleeping This list may not describe all possible side effects. Call your doctor for medical advice about side effects. You may report side effects to FDA at 1-800-FDA-1088. Where should I keep my medicine? Keep out of the reach of children. Store at room temperature between 15 and 30 degrees C (59 and 86 degrees F). Throw away any unused medicine after the expiration date. NOTE: This sheet is a summary. It may not cover all possible information. If you have questions about this medicine, talk to your doctor, pharmacist, or health care provider.    2016, Elsevier/Gold Standard. (2015-04-09 16:14:23)

## 2015-07-21 NOTE — Assessment & Plan Note (Signed)
Recommended that she cut down on her beer as she typically will smoke more when she drinks

## 2015-07-21 NOTE — Progress Notes (Signed)
Patient ID: Jacqualin Combes, female    DOB: 1950/08/28, 64 y.o.   MRN: [id number]  HPI Comments: Ms. Rausch is a pleasant 64 year old woman with a long history of smoking who continues to smoke one half pack per day, hypertension, hyperlipidemia who was previously seen for abnormal EKG, mitral valve disease.  She has a history of alcohol, typically drinks 2-3 beers per day also history of depression, elevated liver enzymes, hypothyroidism She presents today for follow-up after recent CT scan showing coronary artery disease  CT scan reviewed with her in detail Images pulled up in the office  this shows mild to moderate bilateral carotid arterial disease,Mild aortic arch disease, mild descending aorta disease Regions of moderate coronary artery disease noted in the LAD, left circumflex. Images limited secondary to motion artifact  She denies any symptoms concerning for angina. Chronic mild shortness of breath on exertion She's having difficulty with smoking, previously did not want Chantix Reports her blood pressure is well controlled, no lightheaded spells on her current regimen  Last EKG reviewed, 06/17/2015 Shows normal sinus rhythm with no significant ST or T-wave changes, left axis deviation  Other past medical history She had echocardiogram done at [facility] in the past that showed normal LV systolic function, normal RV systolic function, mitral annular calcification, mild MR, TR    Allergies  Allergen Reactions  . Benazepril     unknown  . Erythromycin     Mouth pilled.     Outpatient Encounter Prescriptions as of 07/21/2015  Medication Sig  . albuterol (PROVENTIL HFA;VENTOLIN HFA) 108 (90 BASE) MCG/ACT inhaler Inhale into the lungs every 6 (six) hours as needed for wheezing or shortness of breath.  Marland Kitchen aspirin 81 MG tablet Take 81 mg by mouth daily.  Marland Kitchen atorvastatin (LIPITOR) 20 MG tablet Take 20 mg by mouth every other day.   . busPIRone (BUSPAR) 10 MG tablet Take 1 tablet (10 mg  total) by mouth 2 (two) times daily as needed.  . cyanocobalamin 500 MCG tablet Take 500 mcg by mouth 2 (two) times daily.  . folic acid (FOLVITE) 400 MCG tablet Take 400 mcg by mouth 2 (two) times daily.  Marland Kitchen levothyroxine (SYNTHROID, LEVOTHROID) 75 MCG tablet Take 75 mcg by mouth daily before breakfast.  . metoprolol succinate (TOPROL-XL) 50 MG 24 hr tablet Take 1 tablet (50 mg total) by mouth daily. Take with or immediately following a meal.  . varenicline (CHANTIX CONTINUING MONTH PAK) 1 MG tablet Take 1 tablet (1 mg total) by mouth 2 (two) times daily.   No facility-administered encounter medications on file as of 07/21/2015.    Past Medical History  Diagnosis Date  . Essential hypertension   . Hyperlipidemia   . Anxiety   . Depression   . Osteoporosis   . Hypothyroidism   . Hyponatremia   . Mild Mitral and Tricuspid Regurgitation     a. 08/2014 Echo: EF 60-65%, mild MR/TR.  Marland Kitchen Elevated liver enzymes   . Mild reactive airways disease   . Tobacco use   . Macrocytosis   . Excessive drinking of alcohol   . Vulvar intraepithelial neoplasia II   . Orthostasis     a. 08/2014->diuretics held.  . Personal history of tobacco use, presenting hazards to health 07/10/2015    Past Surgical History  Procedure Laterality Date  . Appendectomy    . Breast enhancement surgery    . Septoplasty    . Abdominal hysterectomy      complete, dermoid tumor  left ovary    Social History  reports that she has been smoking Cigarettes.  She has a 40 pack-year smoking history. She has never used smokeless tobacco. She reports that she drinks about 6.0 oz of alcohol per week. She reports that she does not use illicit drugs.  Family History family history includes CAD in her brother; COPD in her father; Cancer in her mother; Emphysema in her father; Heart attack (age of onset: 1561) in her brother; Heart attack (age of onset: 162) in her father; Heart disease in her brother and father; Heart failure in her  father; Hyperlipidemia in her father; Hypertension in her father; Kidney disease in her father; Stroke in her father. There is no history of Diabetes.   Review of Systems  Constitutional: Negative.   Respiratory: Positive for shortness of breath.   Cardiovascular: Negative.   Gastrointestinal: Negative.   Musculoskeletal: Negative.   Neurological: Negative.   Hematological: Negative.   Psychiatric/Behavioral: Negative.   All other systems reviewed and are negative.   BP 130/90 mmHg  Pulse 81  Ht 5' 6.5" (1.689 m)  Wt 138 lb (62.596 kg)  BMI 21.94 kg/m2  Physical Exam  Constitutional: She is oriented to person, place, and time.  Thin appearing  HENT:  Head: Normocephalic.  Nose: Nose normal.  Mouth/Throat: Oropharynx is clear and moist.  Eyes: Conjunctivae are normal. Pupils are equal, round, and reactive to light.  Neck: Normal range of motion. Neck supple. No JVD present.  Cardiovascular: Normal rate, regular rhythm, S1 normal, S2 normal, normal heart sounds and intact distal pulses.  Exam reveals no gallop and no friction rub.   No murmur heard. Pulmonary/Chest: Effort normal. No respiratory distress. She has decreased breath sounds. She has no wheezes. She has no rales. She exhibits no tenderness.  Abdominal: Soft. Bowel sounds are normal. She exhibits no distension. There is no tenderness.  Musculoskeletal: Normal range of motion. She exhibits no edema or tenderness.  Lymphadenopathy:    She has no cervical adenopathy.  Neurological: She is alert and oriented to person, place, and time. Coordination normal.  Skin: Skin is warm and dry. No rash noted. No erythema.  Psychiatric: She has a normal mood and affect. Her behavior is normal. Judgment and thought content normal.    Assessment and Plan  Nursing note and vitals reviewed.

## 2015-07-21 NOTE — Assessment & Plan Note (Addendum)
We will start Chantix. She is willing to try this with slow titration upwards. Prescription of Chantix has been written for her today with slow titration upwards Starting with half pill for 1 week, then one half pill twice a day then whole pill in a.m., half pill in the p.m., finally whole pill twice a day Also recommended other modalities as well

## 2015-07-21 NOTE — Assessment & Plan Note (Signed)
Diffuse mild atherosclerosis, images shown to her in detail Likely secondary to smoking

## 2015-07-21 NOTE — Assessment & Plan Note (Signed)
Cholesterol is at goal on the current lipid regimen. No changes to the medications were made.  

## 2015-07-21 NOTE — Assessment & Plan Note (Signed)
Images reviewed with her. Difficult to determine degree of stenosis given motion artifact.  Recommend smoking cessation, continue Lipitor every other day as she is doing, cholesterol is at goal. Recommended a pharmacologic Myoview given her shortness of breath, coronary disease on CT. She does not feel that she would be able to stop smoking for 24 hours before the test which is a requirement. After long discussion, she will start Chantix. After she is able to stop smoking prior to the test, we will schedule the perfusion scan for her. Recommended if she has any symptoms concerning for angina, we would go straight to cardiac catheterization

## 2015-07-21 NOTE — Assessment & Plan Note (Addendum)
Mild to moderate carotid disease bilaterally including reasonable amount of calcified plaque noted in the innominate artery. Images shown to her with strong recommendation for smoking cessation

## 2015-07-23 ENCOUNTER — Ambulatory Visit
Admission: RE | Admit: 2015-07-23 | Discharge: 2015-07-23 | Disposition: A | Discharge status: Home / Self Care | Payer: Commercial Managed Care - HMO | Source: Ambulatory Visit | Attending: Family Medicine | Admitting: Family Medicine

## 2015-07-23 DIAGNOSIS — K7689 Other specified diseases of liver: Secondary | ICD-10-CM | POA: Insufficient documentation

## 2015-07-26 ENCOUNTER — Telehealth: Payer: Self-pay | Admitting: Family Medicine

## 2015-07-26 NOTE — Telephone Encounter (Signed)
I talked with patient about US results; most likely benign cysts but if any concern for primary or metastatic cancer, MRI needed; patient says she has never had cancer, she feels fine, felt fine before we started doing all this testing; she'd like to consider them cysts and be done with it; I urged her to contact me if any unexplained abdominal pain, night sweats, weight loss, etc. 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 Valentino Saxonherry (GYN); I will forward this to Dr. Valentino Saxonherry to see if she is concerned that either place on the could represent spread of condition(s) to liver; if any concern at all for spread, would want MRI of liver if GYN conditions could spread to liver --------------------- Dr. Valentino Saxonherry, Please review history and see CT scan and US reports; do you feel MRI of liver is warranted? Could lesions on liver be related to her GYN conditions? Please contact me about your follow-up recommendation. Thank you, Baruch GoutyMelinda Maven Varelas, MD

## 2015-07-26 NOTE — Telephone Encounter (Signed)
Dr. Sherie DonLada,   I have reviewed the CT scan and ultrasound.  I have treated [name] with local excision of the lesions last year, and have subsequently following her, with negative findings at most recent visit with colposcopy.  I do not think the liver cysts are related to her history of VIN/VAIN (precancerous lesions), and do not represent any sort of malignant spread.  I don't feel as though an MRI would be warranted.  Can likely just be followed with a repeat scan (ultrasound) in 6-12 months to ensure no further growth, but I do believe that these cysts are benign and unrelated. If still concerned, can always refer to GI for follow up.    Hildred LaserAnika Tearah Saulsbury, MD

## 2015-07-27 ENCOUNTER — Other Ambulatory Visit: Payer: Self-pay | Admitting: Family Medicine

## 2015-07-27 NOTE — Telephone Encounter (Signed)
Routing to provider  

## 2015-07-28 NOTE — Telephone Encounter (Signed)
Wonderful; thank you, Dr. Valentino Saxonherry I called the patient; she agrees with getting US in 6 months Future flag to remind me June 1st to order She is to call me if any symptoms before then

## 2015-09-17 ENCOUNTER — Telehealth: Payer: Self-pay | Admitting: Family Medicine

## 2015-09-17 NOTE — Telephone Encounter (Signed)
Pt called stated she has not received the Colo-guard yet. Wants to know if there is something she needs to do as it has been a while since this was ordered for her. Please call pt to follow up. Thanks.

## 2015-09-17 NOTE — Telephone Encounter (Signed)
I advised patient I would personally resend the order the cologuard. She was appreciative.

## 2015-09-24 DIAGNOSIS — Z1212 Encounter for screening for malignant neoplasm of rectum: Secondary | ICD-10-CM | POA: Diagnosis not present

## 2015-09-24 DIAGNOSIS — Z1211 Encounter for screening for malignant neoplasm of colon: Secondary | ICD-10-CM | POA: Diagnosis not present

## 2015-09-24 LAB — COLOGUARD: Cologuard: NEGATIVE

## 2015-09-25 ENCOUNTER — Other Ambulatory Visit: Payer: Self-pay

## 2015-09-25 NOTE — Telephone Encounter (Signed)
Pharmacy: [facility] Date Last Dispensed: 03/10/2015  Request for Atorvastatin  tablet # 30.

## 2015-09-26 MED ORDER — ATORVASTATIN CALCIUM 20 MG PO TABS: 20.0000 mg | ORAL_TABLET | ORAL | Status: DC

## 2015-09-26 NOTE — Telephone Encounter (Signed)
Last sgpt and lipid panel reviewed; rx approved; appt in April; she'll follow me to [facility] She is really cutting back on her cigarettes

## 2015-10-12 ENCOUNTER — Telehealth: Payer: Self-pay | Admitting: Family Medicine

## 2015-10-12 DIAGNOSIS — Z1211 Encounter for screening for malignant neoplasm of colon: Secondary | ICD-10-CM

## 2015-10-12 NOTE — Telephone Encounter (Signed)
Patient notified -  Flow sheet up dated

## 2015-10-12 NOTE — Telephone Encounter (Signed)
Patient's cologuard test collected 09/24/15 was negative; great news Please update flowsheet Let patient know, next Cologuard in 2 years

## 2015-11-16 ENCOUNTER — Other Ambulatory Visit: Payer: Self-pay | Admitting: Cardiovascular Disease

## 2015-11-16 ENCOUNTER — Encounter: Payer: Self-pay | Admitting: Family Medicine

## 2015-11-16 ENCOUNTER — Ambulatory Visit (INDEPENDENT_AMBULATORY_CARE_PROVIDER_SITE_OTHER): Payer: Medicare HMO | Admitting: Family Medicine

## 2015-11-16 ENCOUNTER — Ambulatory Visit
Admission: RE | Admit: 2015-11-16 | Discharge: 2015-11-16 | Disposition: A | Discharge status: Home / Self Care | Payer: Medicare HMO | Source: Ambulatory Visit | Attending: Family Medicine | Admitting: Family Medicine

## 2015-11-16 VITALS — BP 128/88 | HR 103 | Temp 98.1°F | Resp 16 | Ht 67.0 in | Wt 135.9 lb

## 2015-11-16 DIAGNOSIS — I2510 Atherosclerotic heart disease of native coronary artery without angina pectoris: Secondary | ICD-10-CM

## 2015-11-16 DIAGNOSIS — K7689 Other specified diseases of liver: Secondary | ICD-10-CM | POA: Diagnosis not present

## 2015-11-16 DIAGNOSIS — Z9882 Breast implant status: Secondary | ICD-10-CM | POA: Diagnosis not present

## 2015-11-16 DIAGNOSIS — Z1231 Encounter for screening mammogram for malignant neoplasm of breast: Secondary | ICD-10-CM | POA: Diagnosis not present

## 2015-11-16 DIAGNOSIS — E038 Other specified hypothyroidism: Secondary | ICD-10-CM

## 2015-11-16 DIAGNOSIS — F17200 Nicotine dependence, unspecified, uncomplicated: Secondary | ICD-10-CM

## 2015-11-16 DIAGNOSIS — F1010 Alcohol abuse, uncomplicated: Secondary | ICD-10-CM | POA: Diagnosis not present

## 2015-11-16 DIAGNOSIS — D7589 Other specified diseases of blood and blood-forming organs: Secondary | ICD-10-CM

## 2015-11-16 DIAGNOSIS — R69 Illness, unspecified: Secondary | ICD-10-CM | POA: Diagnosis not present

## 2015-11-16 DIAGNOSIS — R634 Abnormal weight loss: Secondary | ICD-10-CM | POA: Diagnosis not present

## 2015-11-16 DIAGNOSIS — E785 Hyperlipidemia, unspecified: Secondary | ICD-10-CM

## 2015-11-16 DIAGNOSIS — Z720 Tobacco use: Secondary | ICD-10-CM

## 2015-11-16 DIAGNOSIS — K769 Liver disease, unspecified: Secondary | ICD-10-CM

## 2015-11-16 MED ORDER — MIRTAZAPINE 15 MG PO TABS: 15.0000 mg | ORAL_TABLET | Freq: Every day | ORAL | Status: DC

## 2015-11-16 NOTE — Patient Instructions (Signed)
Start the new medicine for appetite Do let me know of any new findings or problems I do encourage you to quit smoking Call [phone] to sign up for smoking cessation classes You can call 1-800-QUIT-NOW to talk with a smoking cessation coach We'll schedule your MRI If you have not heard anything from my staff in a week about any orders/referrals/studies from today, please contact us here to follow-up [phone]

## 2015-11-16 NOTE — Assessment & Plan Note (Signed)
Limit saturated fats; continue statin every other day

## 2015-11-16 NOTE — Assessment & Plan Note (Signed)
Check CBC; expect it to be improved with less alcohol and now taking folic acid and B12

## 2015-11-16 NOTE — Assessment & Plan Note (Signed)
Patient doing better with smoking; I am here to help if needed

## 2015-11-16 NOTE — Assessment & Plan Note (Signed)
Check TSH and free T4, T3 today and adjust medicine if needed

## 2015-11-16 NOTE — Progress Notes (Signed)
BP 128/88 mmHg  Pulse 103  Temp(Src) 98.1 F (36.7 C) (Oral)  Resp 16  Ht  (1.702 m)  Wt 135 lb 14.4 oz (61.644 kg)  BMI 21.28 kg/m2  SpO2 99%   Subjective:    Patient ID: Meghan Rivers, female    DOB: 1950/11/20, 65 y.o.   MRN: [id number]  HPI: Meghan Rivers is a 65 y.o. female  Chief Complaint  Patient presents with  . Medication Refill    3 month F/U  . Hyperlipidemia    Takes medication every other day, has a little bit of muscle weakness  . Nicotine Dependence    Patient states the Chantix gave her diarrhea so she stopped it but reduced her usage down to 5 cigarettes daily  . Hypertension  . Hypothyroidism    Cold intolerance  . Depression    Taking Buspar but unable to tell a difference  . Weight Loss    Patient would like something for appetite, states she has not had any appetite and losing weight.     She has not had any appetite; she has been doing daily Boost; just no appetite; no early satiety; no abdominal bloating; she has cut down on her cigarettes; smell of food can be nauseating; change in appetite has been going on for six months; went downhill after the eggnog season; no abdominal pain, no specific complaints; she has not lost smell or taste; nose runs when she eats; [name] (her brother) had that too though; no blood in stool; no change in stool caliber; no blood in urine; no persistent cough; no lumps in breast, going today for mammogram; no memory impairment  High cholesterol; taking statin every other day, tolerated pretty well  Thyroid; taking medicine; intolerant of cold; stays cold all the time; no hair loss; no dry skin; BM mvoements are regular; does have some tremor  HTN; controlled today  Not taking Chantix any more; cut down to 5 cigarettes a day now  Relevant past medical, surgical, family and social history reviewed and updated as indicated. Interim medical history since our last visit reviewed. Her alcohol intake is less she  reports; varies with stress  Allergies and medications reviewed and updated.  Review of Systems  Per HPI unless specifically indicated above     Objective:    BP 128/88 mmHg  Pulse 103  Temp(Src) 98.1 F (36.7 C) (Oral)  Resp 16  Ht  (1.702 m)  Wt 135 lb 14.4 oz (61.644 kg)  BMI 21.28 kg/m2  SpO2 99%  Wt Readings from Last 3 Encounters:  11/24/15 133 lb 9.6 oz (60.6 kg)  11/16/15 135 lb 14.4 oz (61.644 kg)  07/21/15 138 lb (62.596 kg)    Physical Exam  Constitutional: She appears well-developed and well-nourished. No distress.  Weight down 3 pounds over last 4 months  HENT:  Head: Normocephalic and atraumatic.  Eyes: EOM are normal. No scleral icterus.  Neck: No thyromegaly present.  Cardiovascular: Normal rate, regular rhythm and normal heart sounds.   No murmur heard. Pulmonary/Chest: Effort normal and breath sounds normal. No respiratory distress. She has no wheezes.  Abdominal: Soft. Bowel sounds are normal. She exhibits no distension.  Musculoskeletal: Normal range of motion. She exhibits no edema.  Neurological: She is alert. She displays no tremor. She exhibits normal muscle tone. Gait normal.  Skin: Skin is warm and dry. She is not diaphoretic. No pallor.  Psychiatric: She has a normal mood and affect. Her behavior  is normal. Judgment and thought content normal.      Assessment & Plan:   Problem List Items Addressed This Visit      Cardiovascular and Mediastinum   Atherosclerosis of coronary artery    Noted on chest CT in Dec 2016; followed by cardiologist; she continues to smoke, unfortunately        Endocrine   Hypothyroidism - Primary    Check TSH and free T4, T3 today and adjust medicine if needed      Relevant Orders   T4, free (Completed)   T3, free (Completed)   TSH (Completed)     Other   Smoker    I am here to help if/when she is ready to quit; smoking increases risk of numerous cancers; she is not prepared to quit just yet       Hyperlipidemia    Limit saturated fats; continue statin every other day      Relevant Orders   Lipid Panel w/o Chol/HDL Ratio (Completed)   Tobacco use    Patient doing better with smoking; I am here to help if needed      Macrocytosis    Check CBC; expect it to be improved with less alcohol and now taking folic acid and B12      Relevant Orders   CBC with Differential/Platelet (Completed)   Excessive drinking of alcohol    Patient continues to drink and smoke; unhealthy habits; will check CBC and liver enzymes today; I am here to help, though she does not sound prepared to stop either drinking or smoking      Lesion of liver    Will get MRI of the liver, hepatic protocol      Relevant Orders   Comprehensive metabolic panel (Completed)   MR Liver W Wo Contrast   Weight loss, unintentional    Smoker; hx of vulvar and cervical neoplasia; chronic alcohol use; thyroid disease; numerous factors to consider; will start with labs; MRI of liver; close f/u; reviewed chest CT done in December         Follow up plan: Return in about 4 weeks (around 12/14/2015) for weight check, appetite.  An after-visit summary was printed and given to the patient at check-out.  Please see the patient instructions which may contain other information and recommendations beyond what is mentioned above in the assessment and plan. Orders Placed This Encounter  Procedures  . MR Liver W Wo Contrast  . T4, free  . T3, free  . CBC with Differential/Platelet  . TSH  . Comprehensive metabolic panel  . Lipid Panel w/o Chol/HDL Ratio

## 2015-11-16 NOTE — Assessment & Plan Note (Signed)
Will get MRI of the liver, hepatic protocol

## 2015-11-17 ENCOUNTER — Telehealth: Payer: Self-pay | Admitting: Family Medicine

## 2015-11-17 ENCOUNTER — Other Ambulatory Visit
Admission: RE | Admit: 2015-11-17 | Discharge: 2015-11-17 | Disposition: A | Discharge status: Home / Self Care | Payer: Medicare HMO | Source: Ambulatory Visit | Attending: Family Medicine | Admitting: Family Medicine

## 2015-11-17 ENCOUNTER — Other Ambulatory Visit: Payer: Self-pay

## 2015-11-17 DIAGNOSIS — E875 Hyperkalemia: Secondary | ICD-10-CM | POA: Insufficient documentation

## 2015-11-17 DIAGNOSIS — D7589 Other specified diseases of blood and blood-forming organs: Secondary | ICD-10-CM

## 2015-11-17 LAB — COMPREHENSIVE METABOLIC PANEL
A/G RATIO: 1.7 (ref 1.2–2.2)
ALK PHOS: 106 IU/L (ref 39–117)
ALT: 49 IU/L — AB (ref 0–32)
AST: 104 IU/L — ABNORMAL HIGH (ref 0–40)
Albumin: 4.4 g/dL (ref 3.6–4.8)
BILIRUBIN TOTAL: 0.4 mg/dL (ref 0.0–1.2)
BUN / CREAT RATIO: 11 — AB (ref 12–28)
BUN: 6 mg/dL — ABNORMAL LOW (ref 8–27)
CHLORIDE: 104 mmol/L (ref 96–106)
CO2: 23 mmol/L (ref 18–29)
Calcium: 10 mg/dL (ref 8.7–10.3)
Creatinine, Ser: 0.57 mg/dL (ref 0.57–1.00)
GFR calc non Af Amer: 97 mL/min/{1.73_m2} (ref >59)
GFR, EST AFRICAN AMERICAN: 112 mL/min/{1.73_m2} (ref >59)
Globulin, Total: 2.6 g/dL (ref 1.5–4.5)
Glucose: 159 mg/dL — ABNORMAL HIGH (ref 65–99)
POTASSIUM: 5.8 mmol/L — AB (ref 3.5–5.2)
Sodium: 148 mmol/L — ABNORMAL HIGH (ref 134–144)
TOTAL PROTEIN: 7 g/dL (ref 6.0–8.5)

## 2015-11-17 LAB — CBC WITH DIFFERENTIAL/PLATELET
BASOS ABS: 0 10*3/uL (ref 0.0–0.2)
Basos: 0 %
EOS (ABSOLUTE): 0.1 10*3/uL (ref 0.0–0.4)
Eos: 1 %
HEMOGLOBIN: 14.7 g/dL (ref 11.1–15.9)
Hematocrit: 43.4 % (ref 34.0–46.6)
Immature Grans (Abs): 0 10*3/uL (ref 0.0–0.1)
Immature Granulocytes: 0 %
LYMPHS ABS: 1.7 10*3/uL (ref 0.7–3.1)
Lymphs: 16 %
MCH: 34.2 pg — AB (ref 26.6–33.0)
MCHC: 33.9 g/dL (ref 31.5–35.7)
MCV: 101 fL — ABNORMAL HIGH (ref 79–97)
MONOCYTES: 6 %
MONOS ABS: 0.6 10*3/uL (ref 0.1–0.9)
NEUTROS ABS: 7.8 10*3/uL — AB (ref 1.4–7.0)
NEUTROS PCT: 77 %
Platelets: 253 10*3/uL (ref 150–379)
RBC: 4.3 x10E6/uL (ref 3.77–5.28)
RDW: 13.8 % (ref 12.3–15.4)
WBC: 10.2 10*3/uL (ref 3.4–10.8)

## 2015-11-17 LAB — LIPID PANEL W/O CHOL/HDL RATIO
CHOLESTEROL TOTAL: 172 mg/dL (ref 100–199)
HDL: 100 mg/dL (ref >39)
LDL Calculated: 35 mg/dL (ref 0–99)
Triglycerides: 184 mg/dL — ABNORMAL HIGH (ref 0–149)
VLDL Cholesterol Cal: 37 mg/dL (ref 5–40)

## 2015-11-17 LAB — TSH: TSH: 0.73 u[IU]/mL (ref 0.450–4.500)

## 2015-11-17 LAB — POTASSIUM: Potassium: 3.4 mmol/L — ABNORMAL LOW (ref 3.5–5.1)

## 2015-11-17 LAB — T3, FREE: T3, Free: 2.5 pg/mL (ref 2.0–4.4)

## 2015-11-17 LAB — T4, FREE: FREE T4: 1.28 ng/dL (ref 0.82–1.77)

## 2015-11-17 NOTE — Telephone Encounter (Signed)
Patient notified

## 2015-11-17 NOTE — Telephone Encounter (Signed)
Please let pt know that her potassium level was high; it might have been from technique of the draw, as sometimes cells open in the tube and release potassium Please ask patient to go to the hospital for a stat potassium now I'll talk with her later today about her other labs

## 2015-11-17 NOTE — Telephone Encounter (Signed)
I talked with patient Meghan Rivers and Meghan Rivers have climbed up again; HDL has gone from 62 to 100; SGOT >> SGPT; I asked again about her alcohol intake Drinking 3 to 3.5 beers a day; two [employer] whiskeys daily during eggnog season but not now; she says she is not drinking any more alcohol than when she had previous labs done In that case, we'll refer to heme-onc; she says her brother made too many RBCs Discussed moderate drinking website, AA as options; she'll look into moderate drinking; encouraged her to cut down to 1 a day; I'm here to help Liver MRI pending STOP cholesterol med for now

## 2015-11-17 NOTE — Assessment & Plan Note (Signed)
Refer to heme

## 2015-11-24 ENCOUNTER — Inpatient Hospital Stay: Payer: Commercial Managed Care - HMO | Attending: Internal Medicine | Admitting: Internal Medicine

## 2015-11-24 ENCOUNTER — Inpatient Hospital Stay: Payer: Commercial Managed Care - HMO

## 2015-11-24 ENCOUNTER — Encounter: Payer: Self-pay | Admitting: Internal Medicine

## 2015-11-24 VITALS — BP 126/93 | HR 88 | Temp 98.1°F | Resp 18 | Ht 67.0 in | Wt 133.6 lb

## 2015-11-24 DIAGNOSIS — F419 Anxiety disorder, unspecified: Secondary | ICD-10-CM | POA: Insufficient documentation

## 2015-11-24 DIAGNOSIS — F329 Major depressive disorder, single episode, unspecified: Secondary | ICD-10-CM | POA: Diagnosis not present

## 2015-11-24 DIAGNOSIS — F17210 Nicotine dependence, cigarettes, uncomplicated: Secondary | ICD-10-CM | POA: Diagnosis not present

## 2015-11-24 DIAGNOSIS — Z7982 Long term (current) use of aspirin: Secondary | ICD-10-CM | POA: Insufficient documentation

## 2015-11-24 DIAGNOSIS — E785 Hyperlipidemia, unspecified: Secondary | ICD-10-CM | POA: Insufficient documentation

## 2015-11-24 DIAGNOSIS — I10 Essential (primary) hypertension: Secondary | ICD-10-CM | POA: Diagnosis not present

## 2015-11-24 DIAGNOSIS — Z79899 Other long term (current) drug therapy: Secondary | ICD-10-CM | POA: Insufficient documentation

## 2015-11-24 DIAGNOSIS — D7589 Other specified diseases of blood and blood-forming organs: Secondary | ICD-10-CM | POA: Insufficient documentation

## 2015-11-24 DIAGNOSIS — R69 Illness, unspecified: Secondary | ICD-10-CM | POA: Diagnosis not present

## 2015-11-24 DIAGNOSIS — E039 Hypothyroidism, unspecified: Secondary | ICD-10-CM | POA: Diagnosis not present

## 2015-11-24 LAB — LACTATE DEHYDROGENASE: LDH: 150 U/L (ref 98–192)

## 2015-11-24 LAB — VITAMIN B12: Vitamin B-12: 935 pg/mL — ABNORMAL HIGH (ref 180–914)

## 2015-11-24 LAB — FOLATE: Folate: 35 ng/mL (ref >5.9)

## 2015-11-24 NOTE — Progress Notes (Signed)
[facility] NOTE  Patient Care Team: Arnetha Courser, MD as PCP - General (Family Medicine) Minna Merritts, MD as Consulting Physician (Cardiology)  CHIEF COMPLAINTS/PURPOSE OF CONSULTATION:   # FEB 2016- MACROCYTOSIS- no anemia/other cytopenia- likely sec to Alcohol  # Excessive Alcohol/ mild elevated LFTs  HISTORY OF PRESENTING ILLNESS:  Meghan Rivers 65 y.o.  female  Who has been referred to [place] for further evaluation of macrocytosis. Reviewing the labs patient had  Mild macrocytosis dating back to February 2016.  Most recent labs 7 days ago show white count of 10 hemoglobin 14.7 MCV 101 and platelets of 253.  Patient has mild elevation of LFTs AST/ALT 104/49 with normal bilirubin.  The ultrasound-  Done in December 2016 showed right and left hepatic lobe cysts-  For which further workup is recommended.  MRI of the liver is pending.   patient complains of chronic fatigue.  Denies any unusual shortness of breath or chest pain.  Otherwise no nausea no vomiting.  Patient has chronic mild tingling and numbness of her lower extremities. She is chronic tremors  Bilateral upper extremities.   ROS: A complete 10 point review of system is done which is negative except mentioned above in history of present illness  MEDICAL HISTORY:  Past Medical History  Diagnosis Date  . Essential hypertension   . Hyperlipidemia   . Anxiety   . Depression   . Osteoporosis   . Hypothyroidism   . Hyponatremia   . Mild Mitral and Tricuspid Regurgitation     a. 08/2014 Echo: EF 60-65%, mild MR/TR.  Marland Kitchen Elevated liver enzymes   . Mild reactive airways disease   . Tobacco use   . Macrocytosis   . Excessive drinking of alcohol   . Vulvar intraepithelial neoplasia II   . Orthostasis     a. 08/2014->diuretics held.  . Personal history of tobacco use, presenting hazards to health 07/10/2015    SURGICAL HISTORY: Past Surgical History  Procedure Laterality Date  . Appendectomy    .  Breast enhancement surgery    . Septoplasty    . Abdominal hysterectomy      complete, dermoid tumor left ovary  . Augmentation mammaplasty Bilateral 1990    SOCIAL HISTORY: lives in [place].  Social History   Social History  . Marital Status: Widowed    Spouse Name: N/A  . Number of Children: N/A  . Years of Education: N/A   Occupational History  . Not on file.   Social History Main Topics  . Smoking status: Current Every Day Smoker -- 1.00 packs/day for 40 years    Types: Cigarettes  . Smokeless tobacco: Never Used     Comment: anticipated quit date 08/25/2015  . Alcohol Use: 6.0 oz/week    3 Cans of beer, 7 Shots of liquor per week  . Drug Use: No  . Sexual Activity: No   Other Topics Concern  . Not on file   Social History Narrative    FAMILY HISTORY: Family History  Problem Relation Age of Onset  . Heart attack Father 71  . Hyperlipidemia Father   . Hypertension Father   . Heart disease Father   . Heart failure Father   . Stroke Father   . Kidney disease Father   . Emphysema Father   . COPD Father   . Heart disease Brother   . Heart attack Brother 22  . CAD Brother   . Cancer Mother  breast  . Breast cancer Mother 60  . Diabetes Neg Hx     ALLERGIES:  is allergic to benazepril and erythromycin.  MEDICATIONS:  Current Outpatient Prescriptions  Medication Sig Dispense Refill  . albuterol (PROVENTIL HFA;VENTOLIN HFA) 108 (90 BASE) MCG/ACT inhaler Inhale into the lungs every 6 (six) hours as needed for wheezing or shortness of breath.    . aspirin 81 MG tablet Take 81 mg by mouth daily.    . busPIRone (BUSPAR) 10 MG tablet Take 1 tablet (10 mg total) by mouth 2 (two) times daily as needed. 60 tablet 5  . cyanocobalamin 500 MCG tablet Take 500 mcg by mouth 2 (two) times daily.    . folic acid (FOLVITE) 400 MCG tablet Take 400 mcg by mouth 2 (two) times daily.    . levothyroxine (SYNTHROID, LEVOTHROID) 75 MCG tablet Take 75 mcg by mouth daily before  breakfast.    . metoprolol succinate (TOPROL-XL) 50 MG 24 hr tablet Take 1 tablet (50 mg total) by mouth daily. Take with or immediately f ollowing a meal. 30 tablet 3  . mirtazapine (REMERON) 15 MG tablet Take 1 tablet (15 mg total) by mouth at bedtime. 30 tablet 1  . atorvastatin (LIPITOR) 20 MG tablet Take 1 tablet (20 mg total) by mouth every other day. (Patient not taking: Reported on 11/24/2015) 15 tablet 1   No current facility-administered medications for this visit.      .  PHYSICAL EXAMINATION:   Filed Vitals:   11/24/15 0838  BP: 126/93  Pulse: 88  Temp: 98.1 F (36.7 C)  Resp: 18   Filed Weights   11/24/15 0838  Weight: 133 lb 9.6 oz (60.6 kg)    GENERAL: Well-nourished well-developed; Alert, no distress and comfortable.   Alone.  EYES: no pallor or icterus OROPHARYNX: no thrush or ulceration; NECK: supple, no masses felt LYMPH:  no palpable lymphadenopathy in the cervical, axillary or inguinal regions LUNGS: clear to auscultation and  No wheeze or crackles HEART/CVS: regular rate & rhythm and no murmurs; No lower extremity edema ABDOMEN: abdomen soft, non-tender and normal bowel sounds Musculoskeletal:no cyanosis of digits and no clubbing  PSYCH: alert & oriented x 3 with fluent speech; positive for bil UE tremors NEURO: no focal motor/sensory deficits SKIN:  no rashes or significant lesions  LABORATORY DATA:  I have reviewed the data as listed Lab Results  Component Value Date   WBC 10.2 11/16/2015   HGB 14.5 09/11/2014   HCT 43.4 11/16/2015   MCV 101* 11/16/2015   PLT 253 11/16/2015    Recent Labs  05/11/15 1106 11/16/15 1200 11/17/15 1150  NA 138 148*  --   K 4.1 5.8* 3.4*  CL 97 104  --   CO2 26 23  --   GLUCOSE 98 159*  --   BUN 5* 6*  --   CREATININE 0.58 0.57  --   CALCIUM 9.1 10.0  --   GFRNONAA 97 97  --   GFRAA 112 112  --   PROT 6.2 7.0  --   ALBUMIN 3.9 4.4  --   AST 16 104*  --   ALT 9 49*  --   ALKPHOS 102 106  --    BILITOT 0.9 0.4  --     RADIOGRAPHIC STUDIES: I have personally reviewed the radiological images as listed and agreed with the findings in the report. Mm Digital Screening W/ Implants Bilateral  11/17/2015  CLINICAL DATA:  Screening. EXAM: DIGITAL SCREENING   BILATERAL MAMMOGRAM WITH IMPLANTS AND CAD The patient has retropectoral implants. Standard and implant displaced views were performed. COMPARISON:  Previous exam(s). ACR Breast Density Category b: There are scattered areas of fibroglandular density. FINDINGS: There are no findings suspicious for malignancy. Images were processed with CAD. IMPRESSION: No mammographic evidence of malignancy. A result letter of this screening mammogram will be mailed directly to the patient. RECOMMENDATION: Screening mammogram in one year. (Code:SM-B-01Y) BI-RADS CATEGORY  1:  Negative. Electronically Signed   By: Evangeline Dakin M.D.   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  No evidence of any other cytopenias.  Likely secondary to  Alcohol use/.  I recommend checking L93 folic acid.  Clinically I do not think patient has any primary bone marrow problem -like MDS/liver disease.  However if patient develops anemia with macrocytosis a bone marrow biopsy might be recommended in future.  I would not recommend a bone marrow biopsy at this time.   I would await  The X90 folic acid;  And if abnormal I would recommend supplementation.  Patient will be called with results.  For now I would not recommend follow-up in the clinic;  Unless of course if patient develops  Persistent anemia or cytopenias.   #  Discussed smoking cessation.  #  Discussed alcohol cessation.   Thank you Dr.Lada for allowing me to participate in the care of your pleasant patient. Please do not hesitate to contact me with questions or concerns in the interim.  # 30 minutes face-to-face with the patient discussing the above plan  of care; more than 50% of time spent on counseling and coordination.     Cammie Sickle, MD 11/24/2015 8:42 AM

## 2015-11-24 NOTE — Patient Instructions (Signed)
Smoking Cessation, Tips for Success If you are ready to quit smoking, congratulations! You have chosen to help yourself be healthier. Cigarettes bring nicotine, tar, carbon monoxide, and other irritants into your body. Your lungs, heart, and blood vessels will be able to work better without these poisons. There are many different ways to quit smoking. Nicotine gum, nicotine patches, a nicotine inhaler, or nicotine nasal spray can help with physical craving. Hypnosis, support groups, and medicines help break the habit of smoking. WHAT THINGS CAN I DO TO MAKE QUITTING EASIER?  Here are some tips to help you quit for good:  Pick a date when you will quit smoking completely. Tell all of your friends and family about your plan to quit on that date.  Do not try to slowly cut down on the number of cigarettes you are smoking. Pick a quit date and quit smoking completely starting on that day.  Throw away all cigarettes.   Clean and remove all ashtrays from your home, work, and car.  On a card, write down your reasons for quitting. Carry the card with you and read it when you get the urge to smoke.  Cleanse your body of nicotine. Drink enough water and fluids to keep your urine clear or pale yellow. Do this after quitting to flush the nicotine from your body.  Learn to predict your moods. Do not let a bad situation be your excuse to have a cigarette. Some situations in your life might tempt you into wanting a cigarette.  Never have "just one" cigarette. It leads to wanting another and another. Remind yourself of your decision to quit.  Change habits associated with smoking. If you smoked while driving or when feeling stressed, try other activities to replace smoking. Stand up when drinking your coffee. Brush your teeth after eating. Sit in a different chair when you read the paper. Avoid alcohol while trying to quit, and try to drink fewer caffeinated beverages. Alcohol and caffeine may urge you to  smoke.  Avoid foods and drinks that can trigger a desire to smoke, such as sugary or spicy foods and alcohol.  Ask people who smoke not to smoke around you.  Have something planned to do right after eating or having a cup of coffee. For example, plan to take a walk or exercise.  Try a relaxation exercise to calm you down and decrease your stress. Remember, you may be tense and nervous for the first 2 weeks after you quit, but this will pass.  Find new activities to keep your hands busy. Play with a pen, coin, or rubber band. Doodle or draw things on paper.  Brush your teeth right after eating. This will help cut down on the craving for the taste of tobacco after meals. You can also try mouthwash.   Use oral substitutes in place of cigarettes. Try using lemon drops, carrots, cinnamon sticks, or chewing gum. Keep them handy so they are available when you have the urge to smoke.  When you have the urge to smoke, try deep breathing.  Designate your home as a nonsmoking area.  If you are a heavy smoker, ask your health care provider about a prescription for nicotine chewing gum. It can ease your withdrawal from nicotine.  Reward yourself. Set aside the cigarette money you save and buy yourself something nice.  Look for support from others. Join a support group or smoking cessation program. Ask someone at home or at work to help you with your plan   to quit smoking.  Always ask yourself, "Do I need this cigarette or is this just a reflex?" Tell yourself, "Today, I choose not to smoke," or "I do not want to smoke." You are reminding yourself of your decision to quit.  Do not replace cigarette smoking with electronic cigarettes (commonly called e-cigarettes). The safety of e-cigarettes is unknown, and some may contain harmful chemicals.  If you relapse, do not give up! Plan ahead and think about what you will do the next time you get the urge to smoke. HOW WILL I FEEL WHEN I QUIT SMOKING? You  may have symptoms of withdrawal because your body is used to nicotine (the addictive substance in cigarettes). You may crave cigarettes, be irritable, feel very hungry, cough often, get headaches, or have difficulty concentrating. The withdrawal symptoms are only temporary. They are strongest when you first quit but will go away within 10-14 days. When withdrawal symptoms occur, stay in control. Think about your reasons for quitting. Remind yourself that these are signs that your body is healing and getting used to being without cigarettes. Remember that withdrawal symptoms are easier to treat than the major diseases that smoking can cause.  Even after the withdrawal is over, expect periodic urges to smoke. However, these cravings are generally short lived and will go away whether you smoke or not. Do not smoke! WHAT RESOURCES ARE AVAILABLE TO HELP ME QUIT SMOKING? Your health care provider can direct you to community resources or hospitals for support, which may include:  Group support.  Education.  Hypnosis.  Therapy.   This information is not intended to replace advice given to you by your health care provider. Make sure you discuss any questions you have with your health care provider.   Document Released: 04/22/2004 Document Revised: 08/15/2014 Document Reviewed: 01/10/2013 Elsevier Interactive Patient Education 2016 Elsevier Inc.  

## 2015-11-25 ENCOUNTER — Telehealth: Payer: Self-pay | Admitting: *Deleted

## 2015-11-25 NOTE — Telephone Encounter (Signed)
-----   Message from Earna CoderGovinda R Brahmanday, MD sent at 11/24/2015 10:33 PM EDT ----- Please inform pt that all her labs are okay; so likely the macrocytosis is likely secondary to alcohol; no follow up needed with us. Thx

## 2015-11-26 NOTE — Telephone Encounter (Signed)
Called Patient but no answer. Left message for her to return our call.

## 2015-11-27 ENCOUNTER — Telehealth: Payer: Self-pay | Admitting: Internal Medicine

## 2015-11-27 NOTE — Telephone Encounter (Signed)
Synetta Failnita returned phone call.

## 2015-11-27 NOTE — Telephone Encounter (Signed)
Patient returned your call. Please call again. Thanks.

## 2015-11-27 NOTE — Telephone Encounter (Signed)
Called patient to inform her that her labs were okay.  MD thinks macrocytosis is most likely due to alcohol and no follow up with us is needed.  Patient verbalized understanding.

## 2015-11-30 ENCOUNTER — Other Ambulatory Visit: Payer: Self-pay | Admitting: Family Medicine

## 2015-12-01 ENCOUNTER — Other Ambulatory Visit: Payer: Self-pay | Admitting: Family Medicine

## 2015-12-01 NOTE — Telephone Encounter (Signed)
Pt not taking this any more DENIED request again; note to pharmacy with denial

## 2015-12-03 ENCOUNTER — Other Ambulatory Visit: Payer: Self-pay | Admitting: Family Medicine

## 2015-12-03 ENCOUNTER — Other Ambulatory Visit: Payer: Self-pay

## 2015-12-03 NOTE — Telephone Encounter (Signed)
Note to staff to call pharmacy; they continue to ask for refills and I keep denying them

## 2015-12-03 NOTE — Telephone Encounter (Signed)
[facility]; they keep asking for refills of this medicine and I have denied the requests; please ask them to stop sending refill requests of atorvastatin; thank you

## 2015-12-06 DIAGNOSIS — R634 Abnormal weight loss: Secondary | ICD-10-CM | POA: Insufficient documentation

## 2015-12-06 NOTE — Assessment & Plan Note (Signed)
I am here to help if/when she is ready to quit; smoking increases risk of numerous cancers; she is not prepared to quit just yet

## 2015-12-06 NOTE — Assessment & Plan Note (Signed)
Patient continues to drink and smoke; unhealthy habits; will check CBC and liver enzymes today; I am here to help, though she does not sound prepared to stop either drinking or smoking

## 2015-12-06 NOTE — Assessment & Plan Note (Signed)
Noted on chest CT in Dec 2016; followed by cardiologist; she continues to smoke, unfortunately

## 2015-12-06 NOTE — Assessment & Plan Note (Signed)
Smoker; hx of vulvar and cervical neoplasia; chronic alcohol use; thyroid disease; numerous factors to consider; will start with labs; MRI of liver; close f/u; reviewed chest CT done in December

## 2015-12-08 ENCOUNTER — Telehealth: Payer: Self-pay | Admitting: Family Medicine

## 2015-12-08 ENCOUNTER — Ambulatory Visit
Admission: RE | Admit: 2015-12-08 | Discharge: 2015-12-08 | Disposition: A | Discharge status: Home / Self Care | Payer: Medicare HMO | Source: Ambulatory Visit | Attending: Family Medicine | Admitting: Family Medicine

## 2015-12-08 DIAGNOSIS — K7689 Other specified diseases of liver: Secondary | ICD-10-CM | POA: Diagnosis not present

## 2015-12-08 DIAGNOSIS — N281 Cyst of kidney, acquired: Secondary | ICD-10-CM | POA: Insufficient documentation

## 2015-12-08 MED ORDER — GADOBENATE DIMEGLUMINE 529 MG/ML IV SOLN
15.0000 mL | Freq: Once | INTRAVENOUS | Status: AC | PRN
  Administered 2015-12-08: 15 mL via INTRAVENOUS

## 2015-12-08 NOTE — Telephone Encounter (Signed)
I talked with patient about MRI results; benign cysts in liver; benign cyst on kidney; no further work-up; she was pleased

## 2015-12-14 ENCOUNTER — Ambulatory Visit: Payer: Medicare HMO | Admitting: Family Medicine

## 2015-12-17 ENCOUNTER — Encounter: Payer: Self-pay | Admitting: Obstetrics and Gynecology

## 2015-12-17 ENCOUNTER — Ambulatory Visit (INDEPENDENT_AMBULATORY_CARE_PROVIDER_SITE_OTHER): Payer: Medicare HMO | Admitting: Obstetrics and Gynecology

## 2015-12-17 VITALS — BP 128/82 | HR 76 | Ht 67.0 in | Wt 137.5 lb

## 2015-12-17 DIAGNOSIS — D071 Carcinoma in situ of vulva: Secondary | ICD-10-CM

## 2015-12-17 DIAGNOSIS — N890 Mild vaginal dysplasia: Secondary | ICD-10-CM

## 2015-12-17 DIAGNOSIS — Z720 Tobacco use: Secondary | ICD-10-CM

## 2015-12-17 NOTE — Progress Notes (Signed)
GYNECOLOGY CLINIC COLPOSCOPY PROCEDURE NOTE  Meghan CombesDonna Dunn Rivers is a 65 y.o. 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  Discussed role for tobacco abuse, need for surveillance.  Patient given informed consent, signed copy in the chart, time out was performed.  Placed in lithotomy position. Vulva and vagina viewed with speculum and colposcope after application of acetic acid.   Colposcopy adequate? Yes No visible lesions and no abnormal vasculature; no biopsies obtained.     Patient was given post procedure instructions.  Can now return to yearly routine surveillance.  Routine preventative health maintenance measures emphasized.  Continued to encourage smoking cessation. Is down to 5 cig/day.  Has tried Chantix, nicotine patch, and gum recently.  Notes gum has helped the most.     Meghan LaserAnika Kazandra Forstrom, MD [facility]

## 2015-12-17 NOTE — Assessment & Plan Note (Signed)
Down to 5 cig daily.  Continuing to work on cessation.  Using nicotine gum.

## 2015-12-21 ENCOUNTER — Ambulatory Visit (INDEPENDENT_AMBULATORY_CARE_PROVIDER_SITE_OTHER): Payer: Medicare HMO | Admitting: Family Medicine

## 2015-12-21 ENCOUNTER — Encounter: Payer: Self-pay | Admitting: Family Medicine

## 2015-12-21 VITALS — BP 114/72 | HR 96 | Temp 98.9°F | Resp 16 | Wt 138.8 lb

## 2015-12-21 DIAGNOSIS — R634 Abnormal weight loss: Secondary | ICD-10-CM

## 2015-12-21 DIAGNOSIS — R69 Illness, unspecified: Secondary | ICD-10-CM | POA: Diagnosis not present

## 2015-12-21 DIAGNOSIS — F1010 Alcohol abuse, uncomplicated: Secondary | ICD-10-CM

## 2015-12-21 DIAGNOSIS — I10 Essential (primary) hypertension: Secondary | ICD-10-CM

## 2015-12-21 MED ORDER — MIRTAZAPINE 15 MG PO TABS: 7.5000 mg | ORAL_TABLET | Freq: Every day | ORAL | Status: DC

## 2015-12-21 NOTE — Progress Notes (Signed)
BP 114/72 mmHg  Pulse 96  Temp(Src) 98.9 F (37.2 C) (Oral)  Resp 16  Wt 138 lb 12.8 oz (62.959 kg)  SpO2 96%   Subjective:    Patient ID: Meghan Rivers, female    DOB: Jun 28, 1951, 65 y.o.   MRN: [id number]  HPI: Meghan Rivers is a 65 y.o. female  Chief Complaint  Patient presents with  . Follow-up    4 weeks for weight check, has gained weight since last visit and states medication is helping   Patient here for weight check; she has started taking remeron and it has helped her appetite and her mood; she didn't even realize her mood needed helping, but now she has more motivation She has been trying to cut down her cigarettes She has been drinking "Quit Tea" and then noticed that she had orange poop; not explosive, but bordering , it was coming; no blood, not bloody at all; some discomfort from needing to go; had been sipping the tea all day long; she will watch it; no pain, no travel; no travel outside [place] Her BP is well-controlled today; on the toprol Gaining some weight on the mirtazipine; taking it at night; still just taking just half of a pill She has cut back on her alcohol; hematologist scolded her Still having white russions, 2-3 a day Handwriting has improved She is drinking water in addition through the day Reviewed Dr. Senaida Lange labs; normal folate, B12 not low, LDH was normal  Depression screen [facility] 2/9 11/16/2015 07/08/2015  Decreased Interest 0 0  Down, Depressed, Hopeless 0 0  PHQ - 2 Score 0 0   Relevant past medical, surgical, family and social history reviewed Past Medical History  Diagnosis Date  . Essential hypertension   . Hyperlipidemia   . Anxiety   . Depression   . Osteoporosis   . Hypothyroidism   . Hyponatremia   . Mild Mitral and Tricuspid Regurgitation     a. 08/2014 Echo: EF 60-65%, mild MR/TR.  Marland Kitchen Elevated liver enzymes   . Mild reactive airways disease   . Tobacco use   . Macrocytosis   . Excessive drinking of alcohol   . Vulvar  intraepithelial neoplasia II   . Orthostasis     a. 08/2014->diuretics held.  . Personal history of tobacco use, presenting hazards to health 07/10/2015   Past Surgical History  Procedure Laterality Date  . Appendectomy    . Breast enhancement surgery    . Septoplasty    . Abdominal hysterectomy      complete, dermoid tumor left ovary  . Augmentation mammaplasty Bilateral 1990   Social History  Substance Use Topics  . Smoking status: Current Every Day Smoker -- 0.25 packs/day for 50 years    Types: Cigarettes  . Smokeless tobacco: Never Used     Comment: anticipated quit date 08/25/2015  . Alcohol Use: 6.0 oz/week    3 Cans of beer, 7 Shots of liquor per week     Comment: 2-3 Beers a day, 2 mixed drinks a day  MD note: patient says she has cut back: 12.5 proof white russian mix, equivalent to about 3 drinks per day  Interim medical history since last visit reviewed; she saw heme-onc doctor and GYN; GYN did colpo and no biopsies needed; she told her cutting back/quitting cigarettes would help down there too  Allergies and medications reviewed  Review of Systems Per HPI unless specifically indicated above     Objective:  BP 114/72 mmHg  Pulse 96  Temp(Src) 98.9 F (37.2 C) (Oral)  Resp 16  Wt 138 lb 12.8 oz (62.959 kg)  SpO2 96%  Wt Readings from Last 3 Encounters:  12/21/15 138 lb 12.8 oz (62.959 kg)  12/17/15 137 lb 8 oz (62.37 kg)  11/24/15 133 lb 9.6 oz (60.6 kg)    Physical Exam  Constitutional: She appears well-developed and well-nourished.  Weight gain more than 5 pounds in 4 weeks  HENT:  Mouth/Throat: Mucous membranes are normal.  Eyes: EOM are normal. No scleral icterus.  Cardiovascular: Normal rate and regular rhythm.   Pulmonary/Chest: Effort normal and breath sounds normal.  Abdominal: She exhibits no distension.  Psychiatric: She has a normal mood and affect. Her behavior is normal.   Results for orders placed or performed in visit on 11/24/15    Vitamin B12  Result Value Ref Range   Vitamin B-12 935 (H) 180 - 914 pg/mL  Folate  Result Value Ref Range   Folate 35.0 >5.9 ng/mL  Lactate dehydrogenase  Result Value Ref Range   LDH 150 98 - 192 U/L      Assessment & Plan:   Problem List Items Addressed This Visit      Cardiovascular and Mediastinum   Essential hypertension - Primary    Controlled today        Other   Excessive drinking of alcohol    Appreciate consultation by heme-onc doctor; I showed her website for Moderation Drinking, gave her hand-out; encouraged her to cut back; hope to hear at f/u that she is down to 1 drink a day      Weight loss, unintentional    Resolved; she has seen GYN, saw heme-onc; mirtazipine is helping with appetite; f/u in 6 months         Follow up plan: Return in about 6 months (around 06/22/2016) for follow-up, but I'm here sooner than that if you need me.  An after-visit summary was printed and given to the patient at check-out.  Please see the patient instructions which may contain other information and recommendations beyond what is mentioned above in the assessment and plan.  Meds ordered this encounter  Medications  . mirtazapine (REMERON) 15 MG tablet    Sig: Take 0.5 tablets (7.5 mg total) by mouth at bedtime.    Dispense:  15 tablet    Refill:  5

## 2015-12-21 NOTE — Assessment & Plan Note (Signed)
Controlled today 

## 2015-12-21 NOTE — Assessment & Plan Note (Signed)
Appreciate consultation by heme-onc doctor; I showed her website for Moderation Drinking, gave her hand-out; encouraged her to cut back; hope to hear at f/u that she is down to 1 drink a day

## 2015-12-21 NOTE — Patient Instructions (Signed)
Keep up the good efforts I am here for you if needed Return in 6 months

## 2015-12-21 NOTE — Assessment & Plan Note (Signed)
Resolved; she has seen GYN, saw heme-onc; mirtazipine is helping with appetite; f/u in 6 months

## 2015-12-29 ENCOUNTER — Encounter: Payer: Self-pay | Admitting: Obstetrics and Gynecology

## 2016-02-05 ENCOUNTER — Telehealth: Payer: Self-pay | Admitting: Family Medicine

## 2016-02-05 DIAGNOSIS — M8580 Other specified disorders of bone density and structure, unspecified site: Secondary | ICD-10-CM

## 2016-02-05 NOTE — Telephone Encounter (Signed)
Pt would like a call back from Dr Lada. °

## 2016-02-06 DIAGNOSIS — M8580 Other specified disorders of bone density and structure, unspecified site: Secondary | ICD-10-CM | POA: Insufficient documentation

## 2016-02-06 NOTE — Telephone Encounter (Signed)
She went to the dentist Grinding and broke a piece of tooth off Bone loss on xrays She used to be on generic fosamax Let's get DEXA and consider Prolia Three servings of calcium and 1000 iu vit D3 daily Smoking associated with bone loss, I have to tell you that I explained; she reports smoking less Please do call to schedule your bone density study; the number to schedule one at either [facility] or [facility] is [phone] Dr. Deanna ArtisLarry Causey [phone]

## 2016-02-11 ENCOUNTER — Other Ambulatory Visit: Payer: Self-pay | Admitting: Family Medicine

## 2016-02-11 DIAGNOSIS — M8580 Other specified disorders of bone density and structure, unspecified site: Secondary | ICD-10-CM | POA: Diagnosis not present

## 2016-02-12 LAB — VITAMIN D 25 HYDROXY (VIT D DEFICIENCY, FRACTURES): VIT D 25 HYDROXY: 28 ng/mL — AB (ref 30–100)

## 2016-02-12 LAB — PARATHYROID HORMONE, INTACT (NO CA): PTH: 56 pg/mL (ref 14–64)

## 2016-02-15 LAB — COMPREHENSIVE METABOLIC PANEL
ALBUMIN: 3.6 g/dL (ref 3.6–5.1)
ALT: 9 U/L (ref 6–29)
AST: 23 U/L (ref 10–35)
Alkaline Phosphatase: 100 U/L (ref 33–130)
BILIRUBIN TOTAL: 0.5 mg/dL (ref 0.2–1.2)
BUN: 6 mg/dL — ABNORMAL LOW (ref 7–25)
CALCIUM: 9 mg/dL (ref 8.6–10.4)
CHLORIDE: 102 mmol/L (ref 98–110)
CO2: 22 mmol/L (ref 20–31)
Creat: 0.66 mg/dL (ref 0.50–0.99)
Glucose, Bld: 101 mg/dL — ABNORMAL HIGH (ref 65–99)
Potassium: 4.4 mmol/L (ref 3.5–5.3)
Sodium: 140 mmol/L (ref 135–146)
Total Protein: 6.4 g/dL (ref 6.1–8.1)

## 2016-02-15 LAB — PHOSPHORUS: Phosphorus: 4 mg/dL (ref 2.1–4.3)

## 2016-03-07 ENCOUNTER — Ambulatory Visit
Admission: RE | Admit: 2016-03-07 | Discharge: 2016-03-07 | Disposition: A | Discharge status: Home / Self Care | Payer: Medicare HMO | Source: Ambulatory Visit | Attending: Family Medicine | Admitting: Family Medicine

## 2016-03-07 DIAGNOSIS — Z720 Tobacco use: Secondary | ICD-10-CM | POA: Diagnosis not present

## 2016-03-07 DIAGNOSIS — M8588 Other specified disorders of bone density and structure, other site: Secondary | ICD-10-CM | POA: Insufficient documentation

## 2016-03-07 DIAGNOSIS — E039 Hypothyroidism, unspecified: Secondary | ICD-10-CM | POA: Diagnosis not present

## 2016-03-07 DIAGNOSIS — M8580 Other specified disorders of bone density and structure, unspecified site: Secondary | ICD-10-CM | POA: Diagnosis not present

## 2016-03-07 DIAGNOSIS — M85852 Other specified disorders of bone density and structure, left thigh: Secondary | ICD-10-CM | POA: Insufficient documentation

## 2016-03-07 DIAGNOSIS — Z780 Asymptomatic menopausal state: Secondary | ICD-10-CM | POA: Insufficient documentation

## 2016-03-30 ENCOUNTER — Other Ambulatory Visit: Payer: Self-pay | Admitting: Family Medicine

## 2016-03-30 NOTE — Telephone Encounter (Signed)
Normal TSH in April; Rx approved 

## 2016-04-12 ENCOUNTER — Other Ambulatory Visit: Payer: Self-pay | Admitting: Cardiovascular Disease

## 2016-04-13 ENCOUNTER — Other Ambulatory Visit: Payer: Self-pay | Admitting: Cardiovascular Disease

## 2016-05-24 ENCOUNTER — Ambulatory Visit: Payer: Commercial Managed Care - HMO | Admitting: Cardiovascular Disease

## 2016-06-22 ENCOUNTER — Encounter: Payer: Self-pay | Admitting: Family Medicine

## 2016-06-22 ENCOUNTER — Ambulatory Visit (INDEPENDENT_AMBULATORY_CARE_PROVIDER_SITE_OTHER): Payer: Medicare HMO | Admitting: Family Medicine

## 2016-06-22 VITALS — BP 138/78 | HR 88 | Temp 98.2°F | Resp 16 | Wt 144.0 lb

## 2016-06-22 DIAGNOSIS — I10 Essential (primary) hypertension: Secondary | ICD-10-CM | POA: Diagnosis not present

## 2016-06-22 DIAGNOSIS — Z5181 Encounter for therapeutic drug level monitoring: Secondary | ICD-10-CM | POA: Diagnosis not present

## 2016-06-22 DIAGNOSIS — D7589 Other specified diseases of blood and blood-forming organs: Secondary | ICD-10-CM

## 2016-06-22 DIAGNOSIS — Z23 Encounter for immunization: Secondary | ICD-10-CM | POA: Diagnosis not present

## 2016-06-22 DIAGNOSIS — E038 Other specified hypothyroidism: Secondary | ICD-10-CM | POA: Diagnosis not present

## 2016-06-22 DIAGNOSIS — Z720 Tobacco use: Secondary | ICD-10-CM | POA: Diagnosis not present

## 2016-06-22 DIAGNOSIS — I700 Atherosclerosis of aorta: Secondary | ICD-10-CM | POA: Diagnosis not present

## 2016-06-22 DIAGNOSIS — D071 Carcinoma in situ of vulva: Secondary | ICD-10-CM

## 2016-06-22 DIAGNOSIS — I6523 Occlusion and stenosis of bilateral carotid arteries: Secondary | ICD-10-CM

## 2016-06-22 DIAGNOSIS — N890 Mild vaginal dysplasia: Secondary | ICD-10-CM | POA: Diagnosis not present

## 2016-06-22 DIAGNOSIS — F1010 Alcohol abuse, uncomplicated: Secondary | ICD-10-CM

## 2016-06-22 DIAGNOSIS — I2510 Atherosclerotic heart disease of native coronary artery without angina pectoris: Secondary | ICD-10-CM | POA: Diagnosis not present

## 2016-06-22 DIAGNOSIS — E782 Mixed hyperlipidemia: Secondary | ICD-10-CM | POA: Diagnosis not present

## 2016-06-22 LAB — CBC WITH DIFFERENTIAL/PLATELET
BASOS ABS: 82 {cells}/uL (ref 0–200)
Basophils Relative: 1 %
EOS ABS: 246 {cells}/uL (ref 15–500)
Eosinophils Relative: 3 %
HEMATOCRIT: 43.5 % (ref 35.0–45.0)
HEMOGLOBIN: 14.2 g/dL (ref 11.7–15.5)
LYMPHS ABS: 1968 {cells}/uL (ref 850–3900)
Lymphocytes Relative: 24 %
MCH: 32.7 pg (ref 27.0–33.0)
MCHC: 32.6 g/dL (ref 32.0–36.0)
MCV: 100.2 fL — AB (ref 80.0–100.0)
MONO ABS: 656 {cells}/uL (ref 200–950)
MPV: 10.1 fL (ref 7.5–12.5)
Monocytes Relative: 8 %
NEUTROS ABS: 5248 {cells}/uL (ref 1500–7800)
NEUTROS PCT: 64 %
Platelets: 297 10*3/uL (ref 140–400)
RBC: 4.34 MIL/uL (ref 3.80–5.10)
RDW: 14.4 % (ref 11.0–15.0)
WBC: 8.2 10*3/uL (ref 3.8–10.8)

## 2016-06-22 LAB — TSH: TSH: 0.77 m[IU]/L

## 2016-06-22 LAB — COMPLETE METABOLIC PANEL WITH GFR
ALBUMIN: 3.8 g/dL (ref 3.6–5.1)
ALK PHOS: 109 U/L (ref 33–130)
ALT: 24 U/L (ref 6–29)
AST: 45 U/L — ABNORMAL HIGH (ref 10–35)
BILIRUBIN TOTAL: 0.3 mg/dL (ref 0.2–1.2)
BUN: 6 mg/dL — ABNORMAL LOW (ref 7–25)
CO2: 26 mmol/L (ref 20–31)
CREATININE: 0.59 mg/dL (ref 0.50–0.99)
Calcium: 9.4 mg/dL (ref 8.6–10.4)
Chloride: 105 mmol/L (ref 98–110)
GFR, Est Non African American: >89 mL/min (ref >=60)
Glucose, Bld: 99 mg/dL (ref 65–99)
Potassium: 4.5 mmol/L (ref 3.5–5.3)
Sodium: 139 mmol/L (ref 135–146)
TOTAL PROTEIN: 6.5 g/dL (ref 6.1–8.1)

## 2016-06-22 NOTE — Assessment & Plan Note (Signed)
Check CBC 

## 2016-06-22 NOTE — Progress Notes (Signed)
BP 138/78   Pulse 88   Temp 98.2 F (36.8 C) (Oral)   Resp 16   Wt 144 lb (65.3 kg)   SpO2 94%   BMI 22.55 kg/m    Subjective:    Patient ID: Meghan Rivers, female    DOB: 1951/07/29, 65 y.o.   MRN: [id number]  HPI: Meghan CombesDonna Dunn Lunz is a 65 y.o. female  Chief Complaint  Patient presents with  . Follow-up   She has seen the dentist, and lost the two back teeth on the upper left side (#14 and #15); gums were a little tighter now, so what she's doing is helping  HTN; checks her BP once in awhile, 130s; knows to stay away from excessive salt; no black licorice; no decongestants  Hypothyroidism; cold most of the time; wearing two layers right now, pajama bottoms and long socks and jeans; some weight gain; hair and skin are the same; no constipation  Insomnia; half of a pill remeron, working well  Drinking alcohol, eggnog season is back; 3 beers a day, one glass of [place] iced tea, then a glass of brandy every day; she is not having abd pain, jaundice, or other liver suggestive sx; easy bruising if dogs scratch her  Vitamin D was 28 last check  Valvular disease; sees cardiologist, Dr. Mariah MillingGollan in November; also CAD, no chest pain  Tobacco abuse; cardiologist told her to quit smoking; 7 cigarettes a day; never smoked more than a pack a day; was at 10 cigs a day, down from before; picks up from habit; had chest CT Jul 10, 2015 for screening for lung cancer since she smokes  Buspirone PRN; anxiety and depression; no SI/HI  Seeing Dr. Valentino Saxonherry (GYN) for abnormal findings; see prob list  She had liver lesions noted, MRI showed cysts -------------------- IMPRESSION: 1. Benign hepatic cysts correspond to the indeterminate lesions on comparison exams. 2. Benign Bosniak 1 renal cysts of the LEFT kidney.  Electronically Signed   By: Genevive BiStewart  Edmunds M.D.   On: 12/08/2015 16:19 -------------------- [facility] 06/22/2016 11/16/2015 07/08/2015  Decreased Interest  0 0 0  Down, Depressed, Hopeless 0 0 0  PHQ - 2 Score 0 0 0   Relevant past medical, surgical, family and social history reviewed Past Medical History:  Diagnosis Date  . Anxiety   . Depression   . Elevated liver enzymes   . Essential hypertension   . Excessive drinking of alcohol   . Hyperlipidemia   . Hyponatremia   . Hypothyroidism   . Macrocytosis   . Mild Mitral and Tricuspid Regurgitation    a. 08/2014 Echo: EF 60-65%, mild MR/TR.  . Mild reactive airways disease   . Orthostasis    a. 08/2014->diuretics held.  . Osteoporosis   . Personal history of tobacco use, presenting hazards to health 07/10/2015  . Tobacco use   . Vulvar intraepithelial neoplasia II    Past Surgical History:  Procedure Laterality Date  . ABDOMINAL HYSTERECTOMY     complete, dermoid tumor left ovary  . APPENDECTOMY    . AUGMENTATION MAMMAPLASTY Bilateral 1990  . BREAST ENHANCEMENT SURGERY    . SEPTOPLASTY     Family History  Problem Relation Age of Onset  . Heart attack Father 7562  . Hyperlipidemia Father   . Hypertension Father   . Heart disease Father   . Heart failure Father   . Stroke Father   . Kidney disease Father   . Emphysema Father   .  COPD Father   . Heart disease Brother   . Heart attack Brother 60  . CAD Brother   . Cancer Mother     breast  . Breast cancer Mother 74  . Diabetes Neg Hx   . Polycythemia Brother    Social History  Substance Use Topics  . Smoking status: Current Every Day Smoker    Packs/day: 0.25    Years: 50.00    Types: Cigarettes  . Smokeless tobacco: Never Used     Comment: anticipated quit date 08/25/2015  . Alcohol use 6.0 oz/week    3 Cans of beer, 7 Shots of liquor per week     Comment: 2-3 Beers a day, 2 mixed drinks a day  patient not really interested in cutting back right now  Interim medical history since last visit reviewed. Allergies and medications reviewed  Review of Systems Per HPI unless specifically indicated above       Objective:    BP 138/78   Pulse 88   Temp 98.2 F (36.8 C) (Oral)   Resp 16   Wt 144 lb (65.3 kg)   SpO2 94%   BMI 22.55 kg/m   Wt Readings from Last 3 Encounters:  06/22/16 144 lb (65.3 kg)  12/21/15 138 lb 12.8 oz (63 kg)  12/17/15 137 lb 8 oz (62.4 kg)    Physical Exam  Constitutional: She appears well-developed and well-nourished.  Weight gain more than 5 pounds in last 6 months  HENT:  Head: Normocephalic and atraumatic.  Mouth/Throat: Mucous membranes are normal.  Eyes: EOM are normal. No scleral icterus.  Neck: No JVD present. No thyromegaly present.  Cardiovascular: Normal rate and regular rhythm.   Pulmonary/Chest: Effort normal and breath sounds normal.  Abdominal: Soft. She exhibits no distension.  Musculoskeletal: She exhibits no edema.  Neurological: She is alert.  Skin: Skin is warm. No pallor.  Psychiatric: She has a normal mood and affect. Her behavior is normal.    Results for orders placed or performed in visit on 02/11/16  Parathyroid hormone, intact (no Ca)  Result Value Ref Range   PTH 56 14 - 64 pg/mL      Assessment & Plan:   Problem List Items Addressed This Visit      Cardiovascular and Mediastinum   Essential hypertension - Primary (Chronic)    Well-controlled; continue DASH guidelines; limit alcohol      Carotid stenosis (Chronic)    Order carotid US      Relevant Orders   US Carotid Bilateral   Atherosclerosis of coronary artery (Chronic)    Continue aspirin and beta-blocker; see Dr. Mariah Milling      Aortic atherosclerosis (HCC) (Chronic)    Encouraged her to quit smoking; last LDL was 35        Endocrine   Hypothyroidism (Chronic)    Check TSH and adjust medicine if needed      Relevant Orders   TSH     Genitourinary   VIN III (vulvar intraepithelial neoplasia III)    Seeing gyn, Dr. Valentino Saxon      VAIN I (vaginal intraepithelial neoplasia grade I)    Seeing gyn, Dr. Valentino Saxon        Other   Tobacco use     Encouraged her to quit; considering but not quite there; I am here to help her quit if/when ready; see AVS      Relevant Orders   CT CHEST LUNG CA SCREEN LOW DOSE W/O CM  Medication monitoring encounter    Check labs      Relevant Orders   COMPLETE METABOLIC PANEL WITH GFR   Macrocytosis    Check CBC      Relevant Orders   CBC with Differential/Platelet   Hyperlipidemia (Chronic)    HDL is high because of the alcohol      Excessive drinking of alcohol (Chronic)    Reviewed safe drinking limit for women; urged her to cut back; she is not interested at this time; I am here to help if/when she is ready to cut back or quit drinking       Other Visit Diagnoses    Needs flu shot       Relevant Orders   Flu vaccine HIGH DOSE PF (Fluzone High dose) (Completed)      Follow up plan: Return in about 6 months (around 12/20/2016) for fasting labs and follow-up.  An after-visit summary was printed and given to the patient at check-out.  Please see the patient instructions which may contain other information and recommendations beyond what is mentioned above in the assessment and plan.  No orders of the defined types were placed in this encounter.   Orders Placed This Encounter  Procedures  . US Carotid Bilateral  . CT CHEST LUNG CA SCREEN LOW DOSE W/O CM  . Flu vaccine HIGH DOSE PF (Fluzone High dose)  . CBC with Differential/Platelet  . TSH  . COMPLETE METABOLIC PANEL WITH GFR

## 2016-06-22 NOTE — Assessment & Plan Note (Signed)
Order carotid US 

## 2016-06-22 NOTE — Assessment & Plan Note (Signed)
Seeing gyn, Dr. Cherry 

## 2016-06-22 NOTE — Assessment & Plan Note (Signed)
Continue aspirin and beta-blocker; see Dr. Mariah MillingGollan

## 2016-06-22 NOTE — Assessment & Plan Note (Signed)
Encouraged her to quit smoking; last LDL was 35

## 2016-06-22 NOTE — Assessment & Plan Note (Signed)
Check TSH and adjust medicine if needed 

## 2016-06-22 NOTE — Assessment & Plan Note (Signed)
HDL is high because of the alcohol

## 2016-06-22 NOTE — Assessment & Plan Note (Signed)
Check labs 

## 2016-06-22 NOTE — Patient Instructions (Addendum)
Take 2,000 iu of vitamin D3 about four days per week I do encourage you to quit smoking Call [phone] to sign up for smoking cessation classes You can call 1-800-QUIT-NOW to talk with a smoking cessation coach We'll get the scan of your carotids I strongly recommend you cut down on your alcohol intake to no more than one drink of alcohol per day; let me know if you want to try to cut down or quit and have difficulty; I'm here to help

## 2016-06-22 NOTE — Assessment & Plan Note (Signed)
Well-controlled; continue DASH guidelines; limit alcohol

## 2016-06-22 NOTE — Assessment & Plan Note (Signed)
Encouraged her to quit; considering but not quite there; I am here to help her quit if/when ready; see AVS

## 2016-06-22 NOTE — Assessment & Plan Note (Signed)
Reviewed safe drinking limit for women; urged her to cut back; she is not interested at this time; I am here to help if/when she is ready to cut back or quit drinking

## 2016-06-22 NOTE — Assessment & Plan Note (Signed)
Seeing gyn, Dr. Valentino Saxonherry

## 2016-06-29 ENCOUNTER — Ambulatory Visit (INDEPENDENT_AMBULATORY_CARE_PROVIDER_SITE_OTHER): Payer: Medicare HMO | Admitting: Cardiovascular Disease

## 2016-06-29 ENCOUNTER — Encounter: Payer: Self-pay | Admitting: Cardiovascular Disease

## 2016-06-29 VITALS — BP 122/86 | HR 76 | Ht 67.0 in | Wt 143.2 lb

## 2016-06-29 DIAGNOSIS — I2510 Atherosclerotic heart disease of native coronary artery without angina pectoris: Secondary | ICD-10-CM

## 2016-06-29 DIAGNOSIS — F1010 Alcohol abuse, uncomplicated: Secondary | ICD-10-CM

## 2016-06-29 DIAGNOSIS — I6523 Occlusion and stenosis of bilateral carotid arteries: Secondary | ICD-10-CM | POA: Diagnosis not present

## 2016-06-29 DIAGNOSIS — I10 Essential (primary) hypertension: Secondary | ICD-10-CM

## 2016-06-29 DIAGNOSIS — E7800 Pure hypercholesterolemia, unspecified: Secondary | ICD-10-CM

## 2016-06-29 DIAGNOSIS — I700 Atherosclerosis of aorta: Secondary | ICD-10-CM

## 2016-06-29 MED ORDER — ATORVASTATIN CALCIUM 20 MG PO TABS: 20.0000 mg | ORAL_TABLET | Freq: Every day | ORAL | 3 refills | Status: DC

## 2016-06-29 NOTE — Progress Notes (Signed)
Cardiology Office Note  Date:  06/29/2016   ID:  Meghan CombesDonna Dunn Rivers, DOB 1950/12/11, MRN [id number]  PCP:  Baruch GoutyMelinda Lada, MD   Chief Complaint  Patient presents with  . other    6 month follow up. Meds reviewed by the pt. verbally. "doing well."     HPI:  [name] is a pleasant 65 year old woman with a long history of smoking who continues to smoke, daily Alcohol intake , hypertension, hyperlipidemia   history of depression,  Hypothyroidism Significant coronary artery disease on CT scan chest, carotid disease, aortic atherosclerosis She presents today for follow-up of her coronary artery disease and PAD  She reports that she continues to smoke, no regular exercise program Reports that she stopped her Lipitor. She is unclear prescription ran out or why she stopped. Without the medication, cholesterol above goal 178 up from 140 Denies any chest pain or shortness of breath on exertion Trying to quit smoking by using nicotine gum, has not tried it yet  We have previously recommended stress test. She was unable to stop smoking for 24 hours prior to the test which is strongly recommended. Test was not performed  EKG on today's visit shows normal sinus rhythm with rate 77 bpm, no significant ST or T-wave changes  Previous CT scan mild to moderate bilateral carotid arterial disease,Mild aortic arch disease, mild descending aorta disease Regions of moderate coronary artery disease noted in the LAD, left circumflex. Images limited secondary to motion artifact  Last EKG reviewed, 06/17/2015 Shows normal sinus rhythm with no significant ST or T-wave changes, left axis deviation  Other past medical history She [facility] that showed normal LV systolic function, normal RV systolic function, mitral annular calcification, mild MR, TR  PMH:   has a past medical history of Anxiety; Depression; Elevated liver enzymes; Essential hypertension; Excessive drinking of  alcohol; Hyperlipidemia; Hyponatremia; Hypothyroidism; Macrocytosis; Mild Mitral and Tricuspid Regurgitation; Mild reactive airways disease; Orthostasis; Osteoporosis; Personal history of tobacco use, presenting hazards to health (07/10/2015); Tobacco use; and Vulvar intraepithelial neoplasia II.  PSH:    Past Surgical History:  Procedure Laterality Date  . ABDOMINAL HYSTERECTOMY     complete, dermoid tumor left ovary  . APPENDECTOMY    . AUGMENTATION MAMMAPLASTY Bilateral 1990  . BREAST ENHANCEMENT SURGERY    . SEPTOPLASTY      Current Outpatient Prescriptions  Medication Sig Dispense Refill  . albuterol (PROVENTIL HFA;VENTOLIN HFA) 108 (90 BASE) MCG/ACT inhaler Inhale into the lungs every 6 (six) hours as needed for wheezing or shortness of breath.    Marland Kitchen. aspirin 81 MG tablet Take 81 mg by mouth daily.    . busPIRone (BUSPAR) 10 MG tablet Take 1 tablet (10 mg total) by mouth 2 (two) times daily as needed. (Patient taking differently: Take 1 tablet (10 mg total) by mouth prn) 60 tablet 5  . cyanocobalamin 500 MCG tablet Take 500 mcg by mouth 2 (two) times daily.    . folic acid (FOLVITE) 400 MCG tablet Take 400 mcg by mouth 2 (two) times daily.    Marland Kitchen. levothyroxine (SYNTHROID, LEVOTHROID) 75 MCG tablet 1 Tablet once each day ORAL 30 tablet 7  . metoprolol succinate (TOPROL-XL) 50 MG 24 hr tablet Take 1 tablet (50 mg total) by mouth daily. Take with or immediately f ollowing a meal. 30 tablet 3  . mirtazapine (REMERON) 15 MG tablet Take 0.5 tablets (7.5 mg total) by mouth at bedtime. 15 tablet 5  No current facility-administered medications for this visit.      Allergies:   Benazepril and Erythromycin   Social History:  The patient  reports that she has been smoking Cigarettes.  She has a 12.50 pack-year smoking history. She has never used smokeless tobacco. She reports that she drinks about 6.0 oz of alcohol per week . She reports that she does not use drugs.   Family History:   family  history includes Breast cancer (age of onset: 1260) in her mother; CAD in her brother; COPD in her father; Cancer in her mother; Emphysema in her father; Heart attack (age of onset: 8561) in her brother; Heart attack (age of onset: 1462) in her father; Heart disease in her brother and father; Heart failure in her father; Hyperlipidemia in her father; Hypertension in her father; Kidney disease in her father; Polycythemia in her brother; Stroke in her father.    Review of Systems: Review of Systems  Constitutional: Negative.   Respiratory: Negative.   Cardiovascular: Negative.   Gastrointestinal: Negative.   Musculoskeletal: Negative.   Neurological: Negative.   Psychiatric/Behavioral: Negative.   All other systems reviewed and are negative.    PHYSICAL EXAM: VS:  BP 122/86 (BP Location: Left Arm, Patient Position: Sitting, Cuff Size: Normal)   Pulse 76   Ht 5\' 7"  (1.702 m)   Wt 143 lb 4 oz (65 kg)   BMI 22.44 kg/m  , BMI Body mass index is 22.44 kg/m. GEN: Well nourished, well developed, in no acute distress  HEENT: normal  Neck: no JVD, carotid bruits, or masses Cardiac: RRR; no murmurs, rubs, or gallops,no edema  Respiratory:  clear to auscultation bilaterally, normal work of breathing GI: soft, nontender, nondistended, + BS MS: no deformity or atrophy  Skin: warm and dry, no rash Neuro:  Strength and sensation are intact Psych: euthymic mood, full affect    Recent Labs: 06/22/2016: ALT 24; BUN 6; Creat 0.59; Hemoglobin 14.2; Platelets 297; Potassium 4.5; Sodium 139; TSH 0.77    Lipid Panel Lab Results  Component Value Date   CHOL 172 11/16/2015   HDL 100 11/16/2015   LDLCALC 35 11/16/2015   TRIG 184 (H) 11/16/2015      Wt Readings from Last 3 Encounters:  06/29/16 143 lb 4 oz (65 kg)  06/22/16 144 lb (65.3 kg)  12/21/15 138 lb 12.8 oz (63 kg)       ASSESSMENT AND PLAN:  Aortic atherosclerosis (HCC) - Plan: EKG 12-Lead Recommended smoking  cessation. Recommended she restart Lipitor every other day given climbing cholesterol above goal High risk of worsening coronary and PAD given continued smoking  Atherosclerosis of native coronary artery of native heart without angina pectoris - Plan: EKG 12-Lead Seen on CT scan in the past, moderate in severity. Smoking cessation, aggressive lipid management Discussed anginal symptoms to watch for  Essential hypertension - Plan: EKG 12-Lead Blood pressure is well controlled on today's visit. No changes made to the medications.  Alcohol intake Recommended alcohol cessation Discussed liver disease with her and coding cirrhosis   Total encounter time more than 15 minutes  Greater than 50% was spent in counseling and coordination of care with the patient   Disposition:   F/U  12 months  Orders Placed This Encounter  Procedures  . EKG 12-Lead     Signed, Dossie Arbourim Koral Thaden, M.D., Ph.D. 06/29/2016  [facility] [place] [phone]

## 2016-06-29 NOTE — Patient Instructions (Signed)
Medication Instructions:   Please restart the atorvastatin every other day  Labwork:  No new labs needed  Testing/Procedures:  No further testing at this time   I recommend watching educational videos on topics of interest to you at:       [email or web address]  Enter code: [email or web address]    Follow-Up: It was a pleasure seeing you in the office today. Please call us if you have new issues that need to be addressed before your next appt.  [phone]  Your physician wants you to follow-up in: 12 months.  You will receive a reminder letter in the mail two months in advance. If you don't receive a letter, please call our office to schedule the follow-up appointment.  If you need a refill on your cardiac medications before your next appointment, please call your pharmacy.

## 2016-07-05 ENCOUNTER — Telehealth: Payer: Self-pay | Admitting: *Deleted

## 2016-07-05 NOTE — Telephone Encounter (Signed)
Notified patient that annual lung cancer screening low dose CT scan is due. Confirmed that patient is within the age range of 55-77, and asymptomatic, (no signs or symptoms of lung cancer). Patient denies illness that would prevent curative treatment for lung cancer if found. The patient is a current smoker, with a 40.25 pack year history. The shared decision making visit was done 07/10/15. Patient is agreeable for CT scan being scheduled.

## 2016-07-25 ENCOUNTER — Telehealth: Payer: Self-pay | Admitting: Family Medicine

## 2016-07-25 NOTE — Telephone Encounter (Signed)
I'm sorry, but I can't really diagnosis this over the phone; I'll recommend she go to urgent care

## 2016-07-25 NOTE — Telephone Encounter (Signed)
Patient notified will schedule an appt with you

## 2016-07-25 NOTE — Telephone Encounter (Signed)
Pt said that she had r shoulder pain since Thursday . Got better but then she now has a bruise on her r side of her breast the size of her hand. Offered her an appt but wanted me to place a message.

## 2016-08-03 ENCOUNTER — Ambulatory Visit (INDEPENDENT_AMBULATORY_CARE_PROVIDER_SITE_OTHER): Payer: Medicare HMO | Admitting: Family Medicine

## 2016-08-03 ENCOUNTER — Ambulatory Visit
Admission: RE | Admit: 2016-08-03 | Discharge: 2016-08-03 | Disposition: A | Discharge status: Home / Self Care | Payer: Medicare HMO | Source: Ambulatory Visit | Attending: Family Medicine | Admitting: Family Medicine

## 2016-08-03 ENCOUNTER — Encounter: Payer: Self-pay | Admitting: Family Medicine

## 2016-08-03 DIAGNOSIS — X58XXXA Exposure to other specified factors, initial encounter: Secondary | ICD-10-CM | POA: Insufficient documentation

## 2016-08-03 DIAGNOSIS — F1010 Alcohol abuse, uncomplicated: Secondary | ICD-10-CM | POA: Diagnosis not present

## 2016-08-03 DIAGNOSIS — S4991XA Unspecified injury of right shoulder and upper arm, initial encounter: Secondary | ICD-10-CM | POA: Insufficient documentation

## 2016-08-03 DIAGNOSIS — R69 Illness, unspecified: Secondary | ICD-10-CM | POA: Diagnosis not present

## 2016-08-03 NOTE — Assessment & Plan Note (Addendum)
Suspect bone chip or torn muscle with the amount of bruising present; order xray; if persistent, call me for MRI; with her alcohol use, she could have had injury without being aware, either remembering the injury, feeling the pain; alcohol could also caused her to be off balance; she is not ready to quit; I am here to help her if/when she wants help

## 2016-08-03 NOTE — Progress Notes (Signed)
BP (!) 142/84 (BP Location: Left Arm, Patient Position: Sitting, Cuff Size: Large)   Pulse 100   Temp 98.9 F (37.2 C)   Resp 16   Ht 5\' 7"  (1.702 m)   Wt 147 lb 3 oz (66.8 kg)   SpO2 96%   BMI 23.05 kg/m    Subjective:    Patient ID: Meghan Rivers, female    DOB: November 22, 1950, 65 y.o.   MRN: [id number]  HPI: Meghan Rivers is a 65 y.o. female  Chief Complaint  Patient presents with  . Breast Pain    pt has a bruise on right breast, bruise is 652 weeks old and not clearing up. pt thinks that she hit her shoulder on her car door but is uncertain   Patient is here for an acute visit; she has a large bruise on her right breast; started about 2 weeks ago; not really getting better she thinks; she thinks she hit the breast on her car door but isn't sure; she has some shoulder pain (right) as well; not dropping things, no weakness or tingling in the right arm; no lumps in the breast, just the bruising; no bleeding from anywhere else (gums, nose, etc.) She drinks alcohol; not ready to quit  Relevant past medical, surgical, family and social history reviewed Past Medical History:  Diagnosis Date  . Anxiety   . Depression   . Elevated liver enzymes   . Essential hypertension   . Excessive drinking of alcohol   . Hyperlipidemia   . Hyponatremia   . Hypothyroidism   . Macrocytosis   . Mild Mitral and Tricuspid Regurgitation    a. 08/2014 Echo: EF 60-65%, mild MR/TR.  . Mild reactive airways disease   . Orthostasis    a. 08/2014->diuretics held.  . Osteoporosis   . Personal history of tobacco use, presenting hazards to health 07/10/2015  . Tobacco use   . Vulvar intraepithelial neoplasia II    Past Surgical History:  Procedure Laterality Date  . ABDOMINAL HYSTERECTOMY     complete, dermoid tumor left ovary  . APPENDECTOMY    . AUGMENTATION MAMMAPLASTY Bilateral 1990  . BREAST ENHANCEMENT SURGERY    . SEPTOPLASTY     Family History  Problem Relation Age of Onset  . Heart  attack Father 2662  . Hyperlipidemia Father   . Hypertension Father   . Heart disease Father   . Heart failure Father   . Stroke Father   . Kidney disease Father   . Emphysema Father   . COPD Father   . Heart disease Brother   . Heart attack Brother 5661  . CAD Brother   . Cancer Mother     breast  . Breast cancer Mother 4760  . Polycythemia Brother   . Diabetes Neg Hx    Social History  Substance Use Topics  . Smoking status: Current Every Day Smoker    Packs/day: 0.25    Years: 50.00    Types: Cigarettes  . Smokeless tobacco: Never Used     Comment: anticipated quit date 08/25/2015  . Alcohol use 6.0 oz/week    3 Cans of beer, 7 Shots of liquor per week     Comment: 2-3 Beers a day, 2 mixed drinks a day   Interim medical history since last visit reviewed. Allergies and medications reviewed  Review of Systems Per HPI unless specifically indicated above     Objective:    BP (!) 142/84 (BP Location: Left Arm,  Patient Position: Sitting, Cuff Size: Large)   Pulse 100   Temp 98.9 F (37.2 C)   Resp 16   Ht 5\' 7"  (1.702 m)   Wt 147 lb 3 oz (66.8 kg)   SpO2 96%   BMI 23.05 kg/m   Wt Readings from Last 3 Encounters:  08/03/16 147 lb 3 oz (66.8 kg)  06/29/16 143 lb 4 oz (65 kg)  06/22/16 144 lb (65.3 kg)    Physical Exam  Constitutional: She appears well-developed and well-nourished. No distress.  Eyes: EOM are normal. No scleral icterus.  Neck: No thyromegaly present.  Cardiovascular: Normal rate.   Pulmonary/Chest: Effort normal.    Extensive bruising over right breast laterally and dependently  Abdominal: She exhibits no distension.  Musculoskeletal:       Right shoulder: She exhibits tenderness. She exhibits normal range of motion.  Neurological: She displays tremor.  Slight tremor  Skin: No pallor.  Complexion consistent with alcohol abuse; no jaundice  Psychiatric: Her behavior is normal. Judgment and thought content normal. Her mood appears anxious. She  does not exhibit a depressed mood.  Slightly anxious in appearance      Assessment & Plan:   Problem List Items Addressed This Visit      Other   Right shoulder injury    Suspect bone chip or torn muscle with the amount of bruising present; order xray; if persistent, call me for MRI; with her alcohol use, she could have had injury without being aware, either remembering the injury, feeling the pain; alcohol could also caused her to be off balance; she is not ready to quit; I am here to help her if/when she wants help      Relevant Orders   DG Shoulder Right (Completed)   Excessive drinking of alcohol (Chronic)    Patient is not ready to quit drinking; I am here to help if/when ready        Follow up plan: No Follow-up on file.  An after-visit summary was printed and given to the patient at check-out.  Please see the patient instructions which may contain other information and recommendations beyond what is mentioned above in the assessment and plan.  No orders of the defined types were placed in this encounter.   Orders Placed This Encounter  Procedures  . DG Shoulder Right

## 2016-08-03 NOTE — Patient Instructions (Signed)
You can have the xray done across the street today and we'll contact you with those results Please let me know if symptoms don't resolve and we can get an MRI

## 2016-08-04 ENCOUNTER — Other Ambulatory Visit: Payer: Self-pay | Admitting: *Deleted

## 2016-08-04 DIAGNOSIS — Z87891 Personal history of nicotine dependence: Secondary | ICD-10-CM

## 2016-08-14 NOTE — Assessment & Plan Note (Signed)
Patient is not ready to quit drinking; I am here to help if/when ready

## 2016-08-25 ENCOUNTER — Ambulatory Visit: Admission: RE | Admit: 2016-08-25 | Payer: Medicare HMO | Source: Ambulatory Visit

## 2016-08-31 ENCOUNTER — Ambulatory Visit: Admission: RE | Admit: 2016-08-31 | Payer: Medicare HMO | Source: Ambulatory Visit

## 2016-09-01 ENCOUNTER — Ambulatory Visit
Admission: RE | Admit: 2016-09-01 | Discharge: 2016-09-01 | Disposition: A | Discharge status: Home / Self Care | Payer: Medicare HMO | Source: Ambulatory Visit | Attending: Oncology | Admitting: Oncology

## 2016-09-01 DIAGNOSIS — I2510 Atherosclerotic heart disease of native coronary artery without angina pectoris: Secondary | ICD-10-CM | POA: Diagnosis not present

## 2016-09-01 DIAGNOSIS — R69 Illness, unspecified: Secondary | ICD-10-CM | POA: Diagnosis not present

## 2016-09-01 DIAGNOSIS — R938 Abnormal findings on diagnostic imaging of other specified body structures: Secondary | ICD-10-CM | POA: Insufficient documentation

## 2016-09-01 DIAGNOSIS — Z87891 Personal history of nicotine dependence: Secondary | ICD-10-CM | POA: Diagnosis not present

## 2016-09-01 DIAGNOSIS — I700 Atherosclerosis of aorta: Secondary | ICD-10-CM | POA: Insufficient documentation

## 2016-09-09 ENCOUNTER — Encounter: Payer: Self-pay | Admitting: *Deleted

## 2016-09-29 ENCOUNTER — Telehealth: Payer: Self-pay | Admitting: Family Medicine

## 2016-09-29 NOTE — Telephone Encounter (Signed)
From chest CT: IMPRESSION:  1. Lung-RADS Category 2, benign appearance or behavior. Continue  annual screening with low-dose chest CT without contrast in 12  months.  2. Coronary artery atherosclerosis. Aortic atherosclerosis.  3. Esophageal air fluid level suggests dysmotility or  gastroesophageal reflux.   Please call patient; offer appt to discuss the chest CT findings, [facility]  Thank you

## 2016-09-30 NOTE — Telephone Encounter (Signed)
LMOM for pt to schedule an appt.

## 2016-10-04 NOTE — Telephone Encounter (Signed)
Please try patient again; send letter and close note if no response Thank you

## 2016-10-05 NOTE — Telephone Encounter (Signed)
Pt scheduled  

## 2016-10-07 ENCOUNTER — Ambulatory Visit (INDEPENDENT_AMBULATORY_CARE_PROVIDER_SITE_OTHER): Payer: Medicare HMO | Admitting: Family Medicine

## 2016-10-07 ENCOUNTER — Encounter: Payer: Self-pay | Admitting: Family Medicine

## 2016-10-07 DIAGNOSIS — I2510 Atherosclerotic heart disease of native coronary artery without angina pectoris: Secondary | ICD-10-CM

## 2016-10-07 DIAGNOSIS — I700 Atherosclerosis of aorta: Secondary | ICD-10-CM | POA: Diagnosis not present

## 2016-10-07 DIAGNOSIS — E038 Other specified hypothyroidism: Secondary | ICD-10-CM | POA: Diagnosis not present

## 2016-10-07 DIAGNOSIS — I6523 Occlusion and stenosis of bilateral carotid arteries: Secondary | ICD-10-CM

## 2016-10-07 DIAGNOSIS — J439 Emphysema, unspecified: Secondary | ICD-10-CM

## 2016-10-07 DIAGNOSIS — J432 Centrilobular emphysema: Secondary | ICD-10-CM | POA: Diagnosis not present

## 2016-10-07 DIAGNOSIS — Z5181 Encounter for therapeutic drug level monitoring: Secondary | ICD-10-CM

## 2016-10-07 DIAGNOSIS — D071 Carcinoma in situ of vulva: Secondary | ICD-10-CM

## 2016-10-07 HISTORY — DX: Emphysema, unspecified: J43.9

## 2016-10-07 LAB — CBC WITH DIFFERENTIAL/PLATELET
Basophils Absolute: 0 cells/uL (ref 0–200)
Basophils Relative: 0 %
Eosinophils Absolute: 332 cells/uL (ref 15–500)
Eosinophils Relative: 4 %
HCT: 43.7 % (ref 35.0–45.0)
Hemoglobin: 14.4 g/dL (ref 11.7–15.5)
Lymphocytes Relative: 27 %
Lymphs Abs: 2241 cells/uL (ref 850–3900)
MCH: 32.7 pg (ref 27.0–33.0)
MCHC: 33 g/dL (ref 32.0–36.0)
MCV: 99.3 fL (ref 80.0–100.0)
MPV: 10.2 fL (ref 7.5–12.5)
Monocytes Absolute: 581 cells/uL (ref 200–950)
Monocytes Relative: 7 %
Neutro Abs: 5146 cells/uL (ref 1500–7800)
Neutrophils Relative %: 62 %
Platelets: 256 10*3/uL (ref 140–400)
RBC: 4.4 MIL/uL (ref 3.80–5.10)
RDW: 14.5 % (ref 11.0–15.0)
WBC: 8.3 10*3/uL (ref 3.8–10.8)

## 2016-10-07 LAB — COMPLETE METABOLIC PANEL WITH GFR
ALT: 16 U/L (ref 6–29)
AST: 41 U/L — ABNORMAL HIGH (ref 10–35)
Albumin: 3.4 g/dL — ABNORMAL LOW (ref 3.6–5.1)
Alkaline Phosphatase: 103 U/L (ref 33–130)
BUN: 3 mg/dL — ABNORMAL LOW (ref 7–25)
CALCIUM: 8.6 mg/dL (ref 8.6–10.4)
CHLORIDE: 103 mmol/L (ref 98–110)
CO2: 27 mmol/L (ref 20–31)
Creat: 0.55 mg/dL (ref 0.50–0.99)
GFR, Est Non African American: >89 mL/min (ref >=60)
Glucose, Bld: 87 mg/dL (ref 65–99)
POTASSIUM: 3.8 mmol/L (ref 3.5–5.3)
Sodium: 139 mmol/L (ref 135–146)
Total Bilirubin: 0.6 mg/dL (ref 0.2–1.2)
Total Protein: 6.1 g/dL (ref 6.1–8.1)

## 2016-10-07 LAB — TSH: TSH: 1.41 mIU/L

## 2016-10-07 NOTE — Assessment & Plan Note (Signed)
Some weight loss; check TSH

## 2016-10-07 NOTE — Assessment & Plan Note (Signed)
Carotid US ordered in November, but I never got results; will order again; aspirin and statin and smoking cessation

## 2016-10-07 NOTE — Progress Notes (Signed)
BP 140/82   Pulse 95   Temp 97.9 F (36.6 C) (Oral)   Resp 18   Wt 140 lb 6.4 oz (63.7 kg)   SpO2 97%   BMI 21.99 kg/m    Subjective:    Patient ID: Meghan Rivers, female    DOB: Apr 18, 1951, 66 y.o.   MRN: [id number]  HPI: Meghan Rivers is a 66 y.o. female  Chief Complaint  Patient presents with  . Results    CT review   Here for chest CT results  Ct Chest Lung Cancer Screening Low Dose Wo Contrast  Result Date: 09/02/2016 CLINICAL DATA:  Asymptomatic current smoker with 40 pack-year history. EXAM: CT CHEST WITHOUT CONTRAST LOW-DOSE FOR LUNG CANCER SCREENING TECHNIQUE: Multidetector CT imaging of the chest was performed following the standard protocol without IV contrast. COMPARISON:  07/10/2015 FINDINGS: Cardiovascular: Aortic and branch vessel atherosclerosis. Tortuous thoracic aorta. Normal heart size, without pericardial effusion. Multivessel coronary artery atherosclerosis. Mediastinum/Nodes: No mediastinal or definite hilar adenopathy, given limitations of unenhanced CT. Fluid level in the esophagus on image 27/series 2. Lungs/Pleura: No pleural fluid.  Mild centrilobular emphysema. Bilateral pulmonary nodules, including at maximally volume derived equivalent diameter 5.1 mm in the right lower lobe, are unchanged. No enlarging or highly suspicious nodules identified. Upper Abdomen: Left hepatic lobe cyst. Right hepatic lobe lesion has been evaluated previously by MR. Normal imaged portions of the spleen, stomach, pancreas, adrenal glands. Musculoskeletal: Bilateral breast implants. Advanced lower cervical spondylosis. IMPRESSION: 1. Lung-RADS Category 2, benign appearance or behavior. Continue annual screening with low-dose chest CT without contrast in 12 months. 2.  Coronary artery atherosclerosis. Aortic atherosclerosis. 3. Esophageal air fluid level suggests dysmotility or gastroesophageal reflux. Electronically Signed   By: Abigail Miyamoto M.D.   On: 09/02/2016 09:33    Aortic athero, branch vessel athero, tortuous aorta, multivessel CAD, emphysema; GERD or dysmotility; no burping, no reflux  Dr. Rockey Situ has changed her statin to 20 mg daily, but she is just taking it every other day; doing that for about 2 months, actually probably more than 6-8 months; last LDL was 35  Shoulder and bruising situation all cleared up  Lost weight, doesn't think anything is wrong, just not as hungry; dry cereal yesterday; buys stuff, but then just not hungry; stressor that she can't figure out; got down to 7 cigarettes a day, now up to 10 cigs a day\  Low vitamin D, taking supplements most day, 2,000 iu daily every other day  Depression screen [facility] 2/9 10/07/2016 06/22/2016 11/16/2015 07/08/2015  Decreased Interest 0 0 0 0  Down, Depressed, Hopeless 0 0 0 0  PHQ - 2 Score 0 0 0 0   Relevant past medical, surgical, family and social history reviewed Past Medical History:  Diagnosis Date  . Anxiety   . Depression   . Elevated liver enzymes   . Emphysema lung ([place]) 10/07/2016  . Essential hypertension   . Excessive drinking of alcohol   . Hyperlipidemia   . Hyponatremia   . Hypothyroidism   . Macrocytosis   . Mild Mitral and Tricuspid Regurgitation    a. 08/2014 Echo: EF 60-65%, mild MR/TR.  . Mild reactive airways disease   . Orthostasis    a. 08/2014->diuretics held.  . Osteoporosis   . Personal history of tobacco use, presenting hazards to health 07/10/2015  . Tobacco use   . Vulvar intraepithelial neoplasia II    Past Surgical History:  Procedure Laterality Date  . ABDOMINAL HYSTERECTOMY  complete, dermoid tumor left ovary  . APPENDECTOMY    . AUGMENTATION MAMMAPLASTY Bilateral 1990  . BREAST ENHANCEMENT SURGERY    . SEPTOPLASTY     Family History  Problem Relation Age of Onset  . Heart attack Father 27  . Hyperlipidemia Father   . Hypertension Father   . Heart disease Father   . Heart failure Father   . Stroke Father   . Kidney disease Father    . Emphysema Father   . COPD Father   . Heart disease Brother   . Heart attack Brother 64  . CAD Brother   . Cancer Mother     breast  . Breast cancer Mother 8  . Diverticulitis Maternal Grandmother   . Diabetes Neg Hx    Social History  Substance Use Topics  . Smoking status: Current Every Day Smoker    Packs/day: 0.25    Years: 50.00    Types: Cigarettes  . Smokeless tobacco: Never Used     Comment: anticipated quit date 08/25/2015  . Alcohol use 6.0 oz/week    3 Cans of beer, 7 Shots of liquor per week     Comment: 2-3 Beers a day, 2 mixed drinks a day  MD note: smoking 1/2 ppd  Interim medical history since last visit reviewed. Allergies and medications reviewed  Review of Systems  Constitutional: Positive for unexpected weight change (decreased appetite; not taking remeron).  Respiratory: Negative for cough.   Cardiovascular: Negative for chest pain and leg swelling.   Per HPI unless specifically indicated above     Objective:    BP 140/82   Pulse 95   Temp 97.9 F (36.6 C) (Oral)   Resp 18   Wt 140 lb 6.4 oz (63.7 kg)   SpO2 97%   BMI 21.99 kg/m   Wt Readings from Last 3 Encounters:  10/07/16 140 lb 6.4 oz (63.7 kg)  09/01/16 147 lb (66.7 kg)  08/03/16 147 lb 3 oz (66.8 kg)    Physical Exam  Constitutional: She appears well-developed and well-nourished.  HENT:  Mouth/Throat: Mucous membranes are normal.  Eyes: EOM are normal. No scleral icterus.  Cardiovascular: Normal rate and regular rhythm.   Pulmonary/Chest: Effort normal and breath sounds normal.  Skin:  Palmar erythema  Psychiatric: She has a normal mood and affect. Her behavior is normal. Her mood appears not anxious. She does not exhibit a depressed mood.   Results for orders placed or performed in visit on 06/22/16  CBC with Differential/Platelet  Result Value Ref Range   WBC 8.2 3.8 - 10.8 K/uL   RBC 4.34 3.80 - 5.10 MIL/uL   Hemoglobin 14.2 11.7 - 15.5 g/dL   HCT 43.5 35.0 - 45.0 %    MCV 100.2 (H) 80.0 - 100.0 fL   MCH 32.7 27.0 - 33.0 pg   MCHC 32.6 32.0 - 36.0 g/dL   RDW 14.4 11.0 - 15.0 %   Platelets 297 140 - 400 K/uL   MPV 10.1 7.5 - 12.5 fL   Neutro Abs 5,248 1,500 - 7,800 cells/uL   Lymphs Abs 1,968 850 - 3,900 cells/uL   Monocytes Absolute 656 200 - 950 cells/uL   Eosinophils Absolute 246 15 - 500 cells/uL   Basophils Absolute 82 0 - 200 cells/uL   Neutrophils Relative % 64 %   Lymphocytes Relative 24 %   Monocytes Relative 8 %   Eosinophils Relative 3 %   Basophils Relative 1 %   Smear Review Criteria  for review not met   TSH  Result Value Ref Range   TSH 0.77 mIU/L  COMPLETE METABOLIC PANEL WITH GFR  Result Value Ref Range   Sodium 139 135 - 146 mmol/L   Potassium 4.5 3.5 - 5.3 mmol/L   Chloride 105 98 - 110 mmol/L   CO2 26 20 - 31 mmol/L   Glucose, Bld 99 65 - 99 mg/dL   BUN 6 (L) 7 - 25 mg/dL   Creat 0.59 0.50 - 0.99 mg/dL   Total Bilirubin 0.3 0.2 - 1.2 mg/dL   Alkaline Phosphatase 109 33 - 130 U/L   AST 45 (H) 10 - 35 U/L   ALT 24 6 - 29 U/L   Total Protein 6.5 6.1 - 8.1 g/dL   Albumin 3.8 3.6 - 5.1 g/dL   Calcium 9.4 8.6 - 10.4 mg/dL   GFR, Est African American >89 >=60 mL/min   GFR, Est Non African American >89 >=60 mL/min      Assessment & Plan:   Problem List Items Addressed This Visit      Cardiovascular and Mediastinum   Carotid stenosis (Chronic)    Carotid US ordered in November, but I never got results; will order again; aspirin and statin and smoking cessation      Relevant Orders   US Carotid Bilateral   Atherosclerosis of coronary artery (Chronic)    Check LDL today; goal is less than 70; start back on 81 mg coated aspirin; follow-up with Dr. Rockey Situ as he recommends; asymptomatic      Aortic atherosclerosis (HCC) (Chronic)    Discussed, seen on CT scan; aspirin plus statin plus smoking cessation      Relevant Orders   US Carotid Bilateral     Respiratory   Emphysema lung ([place])    Not limiting activities  right now; pt urged to quit smoking and I'm here to help; meds in future if needed, but declined right now by patient        Endocrine   Hypothyroidism (Chronic)    Some weight loss; check TSH      Relevant Orders   TSH     Genitourinary   VIN III (vulvar intraepithelial neoplasia III)    Managed by Dr. Marcelline Mates        Other   Medication monitoring encounter    Watch liver and kidneys      Relevant Orders   CBC with Differential/Platelet   COMPLETE METABOLIC PANEL WITH GFR       Follow up plan: Return in about 3 months (around 01/07/2017) for fasting labs and visit with Dr. Sanda Klein.  An after-visit summary was printed and given to the patient at [facility].  Please see the patient instructions which may contain other information and recommendations beyond what is mentioned above in the assessment and plan.  No orders of the defined types were placed in this encounter.   Orders Placed This Encounter  Procedures  . US Carotid Bilateral  . CBC with Differential/Platelet  . COMPLETE METABOLIC PANEL WITH GFR  . TSH

## 2016-10-07 NOTE — Assessment & Plan Note (Signed)
Discussed, seen on CT scan; aspirin plus statin plus smoking cessation

## 2016-10-07 NOTE — Assessment & Plan Note (Signed)
Not limiting activities right now; pt urged to quit smoking and I'm here to help; meds in future if needed, but declined right now by patient

## 2016-10-07 NOTE — Patient Instructions (Signed)
We'll get a carotid ultrasound We'll get labs If you develop any symptoms related to your heart, call Dr. Mariah MillingGollan, or 911 right away if any signs or symptoms of a heart attack Try to limit saturated fats in your diet (bologna, hot dogs, barbeque, cheeseburgers, hamburgers, steak, bacon, sausage, cheese, etc.) and get more fresh fruits, vegetables, and whole grains Try to limit saturated fats in your diet (bologna, hot dogs, barbeque, cheeseburgers, hamburgers, steak, bacon, sausage, cheese, etc.) and get more fresh fruits, vegetables, and whole grains Start aspirin back I do encourage you to quit smoking Call [phone] to sign up for smoking cessation classes You can call 1-800-QUIT-NOW to talk with a smoking cessation coach

## 2016-10-07 NOTE — Assessment & Plan Note (Signed)
Managed by Dr. Valentino Saxonherry

## 2016-10-07 NOTE — Assessment & Plan Note (Signed)
Watch liver and kidneys 

## 2016-10-07 NOTE — Assessment & Plan Note (Addendum)
Check LDL today; goal is less than 70; start back on 81 mg coated aspirin; follow-up with Dr. Mariah MillingGollan as he recommends; asymptomatic

## 2016-10-27 ENCOUNTER — Ambulatory Visit
Admission: RE | Admit: 2016-10-27 | Discharge: 2016-10-27 | Disposition: A | Discharge status: Home / Self Care | Payer: Medicare HMO | Source: Ambulatory Visit | Attending: Family Medicine | Admitting: Family Medicine

## 2016-10-27 DIAGNOSIS — I700 Atherosclerosis of aorta: Secondary | ICD-10-CM | POA: Diagnosis not present

## 2016-10-27 DIAGNOSIS — I6523 Occlusion and stenosis of bilateral carotid arteries: Secondary | ICD-10-CM | POA: Insufficient documentation

## 2016-12-12 ENCOUNTER — Other Ambulatory Visit: Payer: Self-pay | Admitting: Cardiovascular Disease

## 2016-12-20 ENCOUNTER — Encounter: Payer: Self-pay | Admitting: Obstetrics and Gynecology

## 2016-12-20 ENCOUNTER — Ambulatory Visit (INDEPENDENT_AMBULATORY_CARE_PROVIDER_SITE_OTHER): Payer: Medicare HMO | Admitting: Obstetrics and Gynecology

## 2016-12-20 VITALS — BP 130/94 | HR 108 | Ht 67.0 in | Wt 132.3 lb

## 2016-12-20 DIAGNOSIS — Z1231 Encounter for screening mammogram for malignant neoplasm of breast: Secondary | ICD-10-CM | POA: Diagnosis not present

## 2016-12-20 DIAGNOSIS — Z87412 Personal history of vulvar dysplasia: Secondary | ICD-10-CM

## 2016-12-20 DIAGNOSIS — E559 Vitamin D deficiency, unspecified: Secondary | ICD-10-CM | POA: Diagnosis not present

## 2016-12-20 DIAGNOSIS — Z01419 Encounter for gynecological examination (general) (routine) without abnormal findings: Secondary | ICD-10-CM | POA: Diagnosis not present

## 2016-12-20 DIAGNOSIS — R630 Anorexia: Secondary | ICD-10-CM

## 2016-12-20 DIAGNOSIS — Z720 Tobacco use: Secondary | ICD-10-CM | POA: Diagnosis not present

## 2016-12-20 DIAGNOSIS — Z1239 Encounter for other screening for malignant neoplasm of breast: Secondary | ICD-10-CM

## 2016-12-20 NOTE — Progress Notes (Signed)
ANNUAL PREVENTATIVE CARE GYNECOLOGY  ENCOUNTER NOTE  Subjective:       Meghan Rivers is a 66 y.o. G2P2 female here for a routine annual gynecologic exam. Of note, patient has a h/o VIN II. The patient is notsexually active. The patient is not taking hormone replacement therapy. Patient denies post-menopausal vaginal bleeding. The patient wears seatbelts: yes. The patient participates in regular exercise: no. Has the patient ever been transfused or tattooed?: no. The patient reports that there is not domestic violence in her life.  Current complaints: 1.  None    Gynecologic History No LMP recorded. Patient has had a hysterectomy. Contraception: status post hysterectomy Last Pap: 03/2014. Results were: normal Last mammogram: 11/2015. Results were: normal Last Colonoscopy: Declined. Had Cologuard testing performed 09/2015, negative.  Last Dexa Scan: 04/2014 Osteopenia, T score -1.9, with low FRAX score   Obstetric History OB History  Gravida Para Term Preterm AB Living  2 2       2   SAB TAB Ectopic Multiple Live Births          2    # Outcome Date GA Lbr Len/2nd Weight Sex Delivery Anes PTL Lv  2 Para     M Vag-Spont   LIV  1 Para     F Vag-Spont   LIV      Past Medical History:  Diagnosis Date  . Anxiety   . Depression   . Elevated liver enzymes   . Emphysema lung (HCC) 10/07/2016  . Essential hypertension   . Excessive drinking of alcohol   . Hyperlipidemia   . Hyponatremia   . Hypothyroidism   . Macrocytosis   . Mild Mitral and Tricuspid Regurgitation    a. 08/2014 Echo: EF 60-65%, mild MR/TR.  . Mild reactive airways disease   . Orthostasis    a. 08/2014->diuretics held.  . Osteoporosis   . Personal history of tobacco use, presenting hazards to health 07/10/2015  . Tobacco use   . Vulvar intraepithelial neoplasia II     Family History  Problem Relation Age of Onset  . Heart attack Father 21  . Hyperlipidemia Father   . Hypertension Father   . Heart disease  Father   . Heart failure Father   . Stroke Father   . Kidney disease Father   . Emphysema Father   . COPD Father   . Heart disease Brother   . Heart attack Brother 30  . CAD Brother   . Cancer Mother        breast  . Breast cancer Mother 54  . Diverticulitis Maternal Grandmother   . Diabetes Neg Hx     Past Surgical History:  Procedure Laterality Date  . ABDOMINAL HYSTERECTOMY     complete, dermoid tumor left ovary  . APPENDECTOMY    . AUGMENTATION MAMMAPLASTY Bilateral 1990  . BREAST ENHANCEMENT SURGERY    . SEPTOPLASTY      Social History   Social History  . Marital status: Widowed    Spouse name: N/A  . Number of children: N/A  . Years of education: N/A   Occupational History  . Not on file.   Social History Main Topics  . Smoking status: Current Every Day Smoker    Packs/day: 0.25    Years: 50.00    Types: Cigarettes  . Smokeless tobacco: Never Used     Comment: 7-8 cigs per day   . Alcohol use 6.0 oz/week    3 Cans of beer,  7 Shots of liquor per week     Comment: 2-3 Beers a day, 2 mixed drinks a day  . Drug use: No     Comment: History of marijuana use  . Sexual activity: No   Other Topics Concern  . Not on file   Social History Narrative  . No narrative on file    Current Outpatient Prescriptions on File Prior to Visit  Medication Sig Dispense Refill  . albuterol (PROVENTIL HFA;VENTOLIN HFA) 108 (90 BASE) MCG/ACT inhaler Inhale into the lungs every 6 (six) hours as needed for wheezing or shortness of breath.    Marland Kitchen. atorvastatin (LIPITOR) 20 MG tablet Take 1 tablet (20 mg total) by mouth daily. 90 tablet 3  . busPIRone (BUSPAR) 10 MG tablet Take 1 tablet (10 mg total) by mouth 2 (two) times daily as needed. (Patient taking differently: Take 1 tablet (10 mg total) by mouth prn) 60 tablet 5  . cyanocobalamin 500 MCG tablet Take 500 mcg by mouth 2 (two) times daily.    . folic acid (FOLVITE) 400 MCG tablet Take 400 mcg by mouth 2 (two) times daily.      Marland Kitchen. levothyroxine (SYNTHROID, LEVOTHROID) 75 MCG tablet 1 Tablet once each day ORAL 30 tablet 7  . metoprolol succinate (TOPROL-XL) 50 MG 24 hr tablet Take 1 tablet (50 mg total) by mouth daily. Take with or immediately f ollowing a meal. 30 tablet 0  . mirtazapine (REMERON) 15 MG tablet Take 0.5 tablets (7.5 mg total) by mouth at bedtime. 15 tablet 5   No current facility-administered medications on file prior to visit.     Allergies  Allergen Reactions  . Benazepril     unknown  . Erythromycin     Mouth pilled.       Review of Systems ROS Review of Systems - General ROS: positive for weight loss (5 lbs since last year negative for - chills, fatigue, fever, hot flashes, night sweats, weight gain or  Psychological ROS: negative for - anxiety, decreased libido, depression, mood swings, physical abuse or sexual abuse Ophthalmic ROS: negative for - blurry vision, eye pain or loss of vision ENT ROS: negative for - headaches, hearing change, visual changes or vocal changes Allergy and Immunology ROS: negative for - hives, itchy/watery eyes or seasonal allergies Hematological and Lymphatic ROS: negative for - bleeding problems, bruising, swollen lymph nodes or weight loss Endocrine ROS: negative for - galactorrhea, hair pattern changes, hot flashes, malaise/lethargy, mood swings, palpitations, polydipsia/polyuria, skin changes, temperature intolerance or unexpected weight changes Breast ROS: negative for - new or changing breast lumps or nipple discharge Respiratory ROS: negative for - cough or shortness of breath Cardiovascular ROS: negative for - chest pain, irregular heartbeat, palpitations or shortness of breath Gastrointestinal ROS: no abdominal pain, change in bowel habits, or black or bloody stools.  Decreased appetite, has been ongoing for "some time", but worse over the past month or so.  Genito-Urinary ROS: no dysuria, trouble voiding, or hematuria Musculoskeletal ROS: negative  for - joint pain or joint stiffness Neurological ROS: negative for - bowel and bladder control changes Dermatological ROS: negative for rash and skin lesion changes   Objective:   BP (!) 130/94 (BP Location: Left Arm, Patient Position: Sitting, Cuff Size: Normal)   Pulse (!) 108   Ht 5\' 7"  (1.702 m)   Wt 132 lb 4.8 oz (60 kg)   BMI 20.72 kg/m  CONSTITUTIONAL: Well-developed, well-nourished female in no acute distress.  PSYCHIATRIC: Normal mood and  affect. Normal behavior. Normal judgment and thought content. NEUROLGIC: Alert and oriented to person, place, and time. Normal muscle tone coordination. No cranial nerve deficit noted. HENT:  Normocephalic, atraumatic, External right and left ear normal. Oropharynx is clear and moist EYES: Conjunctivae and EOM are normal. Pupils are equal, round, and reactive to light. No scleral icterus.  NECK: Normal range of motion, supple, no masses.  Normal thyroid.  SKIN: Skin is warm and dry. No rash noted. Not diaphoretic. No erythema. No pallor. CARDIOVASCULAR: Normal heart rate noted, regular rhythm, no murmur. RESPIRATORY: Clear to auscultation bilaterally. Effort and breath sounds normal, no problems with respiration noted. BREASTS: Symmetric in size. No masses, skin changes, nipple drainage, or lymphadenopathy. ABDOMEN: Soft, normal bowel sounds, no distention noted.  No tenderness, rebound or guarding.  BLADDER: Normal PELVIC:  Bladder no bladder distension noted  Urethra: normal appearing urethra with no masses, tenderness or lesions  Vulva: normal appearing vulva with no masses, tenderness or lesions  Vagina: atrophic, but otherwise normal appearing vagina with and slight thin white discharge, no odor, no lesions  Cervix: surgically absent  Uterus: surgically absent, vaginal cuff well healed  Adnexa: surgically absent bilateral  RV: External Exam NormaI, No Rectal Masses and Normal Sphincter tone  MUSCULOSKELETAL: Normal range of motion. No  tenderness.  No cyanosis, clubbing, or edema.  2+ distal pulses. LYMPHATIC: No Axillary, Supraclavicular, or Inguinal Adenopathy.    Labs:  Lab Results  Component Value Date   WBC 8.3 10/07/2016   HGB 14.4 10/07/2016   HCT 43.7 10/07/2016   MCV 99.3 10/07/2016   PLT 256 10/07/2016    Lab Results  Component Value Date   CREATININE 0.55 10/07/2016   BUN 3 (L) 10/07/2016   NA 139 10/07/2016   K 3.8 10/07/2016   CL 103 10/07/2016   CO2 27 10/07/2016    Lab Results  Component Value Date   ALT 16 10/07/2016   AST 41 (H) 10/07/2016   ALKPHOS 103 10/07/2016   BILITOT 0.6 10/07/2016    Lab Results  Component Value Date   CHOL 172 11/16/2015   HDL 100 11/16/2015   LDLCALC 35 11/16/2015   TRIG 184 (H) 11/16/2015    Lab Results  Component Value Date   TSH 1.41 10/07/2016    Results for DEMRI, POULTON (MRN [id number]) as of 12/20/2016 22:23  Ref. Range 02/11/2016 10:47  Vitamin D, 25-Hydroxy Latest Ref Range: 30 - 100 ng/mL 28 (L)   Assessment:   Annual gynecologic examination 66 y.o. Normal BMI Problem List Items Addressed This Visit      Other   Breast cancer screening   Relevant Orders   MM DIGITAL SCREENING BILATERAL    Other Visit Diagnoses    Well woman exam with routine gynecological exam    -  Primary   History of dysplasia of vulva       Vitamin D insufficiency       Decreased appetite       Tobacco abuse          Plan:  Pap: Not needed Mammogram: Ordered Stool Guaiac Testing:  Not Indicated.  Cologuard testing performed last year was negative, good for 3 years.  Labs: All labs reviewed. Routine preventative health maintenance measures emphasized: Exercise/Diet/Weight control, Tobacco Warnings, Alcohol/Substance use risks and Safe Sex  Vitamin D insufficiency, patient currently taking a Vitamin D supplement.  Decreased appetite - patient notes appetite not related to anything in particular (no early satiety, no depression  symptoms).  States that  she goes to buy foods she thinks she will eat or crave, but when she gets home she no longer has the taste for it and so doesn't eat it. Notes she may begin on her Remeron medication again to increase her appetite (as she has been off it for some time).  Continued to encourage tobacco cessation for overall general health, as well as with h/o of VIN II as this is know to be a risk factor.  Notes that she has decreased cigarette use significantly over the past year, but still has been unable to completely kick the habit.  Offered resources (smoking cessation hotline, help groups, medications). Patient notes that she will continue to try weaning down as this is what has been working for her in the past.  Return to Clinic - 1 Year   Hildred Laser, MD [facility]

## 2016-12-30 NOTE — Telephone Encounter (Signed)
ERRENOUS °

## 2017-01-09 ENCOUNTER — Other Ambulatory Visit: Payer: Self-pay | Admitting: Cardiovascular Disease

## 2017-01-09 ENCOUNTER — Ambulatory Visit: Payer: Medicare HMO | Admitting: Family Medicine

## 2017-01-13 ENCOUNTER — Telehealth: Payer: Self-pay | Admitting: Family Medicine

## 2017-01-13 ENCOUNTER — Encounter: Payer: Self-pay | Admitting: Family Medicine

## 2017-01-13 ENCOUNTER — Ambulatory Visit (INDEPENDENT_AMBULATORY_CARE_PROVIDER_SITE_OTHER): Payer: Medicare HMO | Admitting: Family Medicine

## 2017-01-13 VITALS — BP 124/86 | HR 94 | Temp 98.0°F | Resp 16 | Ht 67.0 in | Wt 131.3 lb

## 2017-01-13 DIAGNOSIS — R634 Abnormal weight loss: Secondary | ICD-10-CM

## 2017-01-13 DIAGNOSIS — R7401 Elevation of levels of liver transaminase levels: Secondary | ICD-10-CM

## 2017-01-13 DIAGNOSIS — I6523 Occlusion and stenosis of bilateral carotid arteries: Secondary | ICD-10-CM | POA: Diagnosis not present

## 2017-01-13 DIAGNOSIS — N890 Mild vaginal dysplasia: Secondary | ICD-10-CM

## 2017-01-13 DIAGNOSIS — F1010 Alcohol abuse, uncomplicated: Secondary | ICD-10-CM

## 2017-01-13 DIAGNOSIS — R197 Diarrhea, unspecified: Secondary | ICD-10-CM | POA: Diagnosis not present

## 2017-01-13 DIAGNOSIS — J432 Centrilobular emphysema: Secondary | ICD-10-CM | POA: Diagnosis not present

## 2017-01-13 DIAGNOSIS — Z720 Tobacco use: Secondary | ICD-10-CM

## 2017-01-13 DIAGNOSIS — S4991XA Unspecified injury of right shoulder and upper arm, initial encounter: Secondary | ICD-10-CM

## 2017-01-13 DIAGNOSIS — E8809 Other disorders of plasma-protein metabolism, not elsewhere classified: Secondary | ICD-10-CM

## 2017-01-13 DIAGNOSIS — K909 Intestinal malabsorption, unspecified: Secondary | ICD-10-CM | POA: Diagnosis not present

## 2017-01-13 DIAGNOSIS — R740 Nonspecific elevation of levels of transaminase and lactic acid dehydrogenase [LDH]: Secondary | ICD-10-CM

## 2017-01-13 DIAGNOSIS — R69 Illness, unspecified: Secondary | ICD-10-CM | POA: Diagnosis not present

## 2017-01-13 LAB — LIPID PANEL
CHOL/HDL RATIO: 1.9 ratio (ref <5.0)
Cholesterol: 117 mg/dL (ref <200)
HDL: 61 mg/dL (ref >50)
LDL CALC: 38 mg/dL (ref <100)
Triglycerides: 91 mg/dL (ref <150)
VLDL: 18 mg/dL (ref <30)

## 2017-01-13 LAB — COMPLETE METABOLIC PANEL WITH GFR
ALBUMIN: 3.1 g/dL — AB (ref 3.6–5.1)
ALK PHOS: 121 U/L (ref 33–130)
ALT: 24 U/L (ref 6–29)
AST: 97 U/L — AB (ref 10–35)
BUN: 3 mg/dL — AB (ref 7–25)
CALCIUM: 8 mg/dL — AB (ref 8.6–10.4)
CHLORIDE: 97 mmol/L — AB (ref 98–110)
CO2: 29 mmol/L (ref 20–31)
CREATININE: 0.54 mg/dL (ref 0.50–0.99)
GFR, Est African American: >89 mL/min (ref >=60)
GFR, Est Non African American: >89 mL/min (ref >=60)
GLUCOSE: 109 mg/dL — AB (ref 65–99)
Potassium: 3.1 mmol/L — ABNORMAL LOW (ref 3.5–5.3)
Sodium: 135 mmol/L (ref 135–146)
Total Bilirubin: 1.4 mg/dL — ABNORMAL HIGH (ref 0.2–1.2)
Total Protein: 5.5 g/dL — ABNORMAL LOW (ref 6.1–8.1)

## 2017-01-13 LAB — CBC WITH DIFFERENTIAL/PLATELET
BASOS ABS: 55 {cells}/uL (ref 0–200)
Basophils Relative: 1 %
Eosinophils Absolute: 55 cells/uL (ref 15–500)
Eosinophils Relative: 1 %
HEMATOCRIT: 38.6 % (ref 35.0–45.0)
Hemoglobin: 12.6 g/dL (ref 11.7–15.5)
LYMPHS PCT: 20 %
Lymphs Abs: 1100 cells/uL (ref 850–3900)
MCH: 32.4 pg (ref 27.0–33.0)
MCHC: 32.6 g/dL (ref 32.0–36.0)
MCV: 99.2 fL (ref 80.0–100.0)
MPV: 10.3 fL (ref 7.5–12.5)
Monocytes Absolute: 495 cells/uL (ref 200–950)
Monocytes Relative: 9 %
NEUTROS PCT: 69 %
Neutro Abs: 3795 cells/uL (ref 1500–7800)
Platelets: 222 10*3/uL (ref 140–400)
RBC: 3.89 MIL/uL (ref 3.80–5.10)
RDW: 15.5 % — AB (ref 11.0–15.0)
WBC: 5.5 10*3/uL (ref 3.8–10.8)

## 2017-01-13 LAB — TSH: TSH: 0.33 mIU/L — ABNORMAL LOW

## 2017-01-13 MED ORDER — POTASSIUM CHLORIDE CRYS ER 20 MEQ PO TBCR: 20.0000 meq | EXTENDED_RELEASE_TABLET | Freq: Two times a day (BID) | ORAL | 0 refills | Status: DC

## 2017-01-13 NOTE — Telephone Encounter (Signed)
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 Mariah MillingGollan that I would like her to cut lipitor to 10 mg nightly while working up liver; LDL is only 38 anyway Cut down alcohol by 50% if able, I am here to help I believe her alcohol is damaging her body, and urged her to really cut down and then quit Other labs pending

## 2017-01-13 NOTE — Assessment & Plan Note (Signed)
Asymptomatic, encouraged smoking cessation

## 2017-01-13 NOTE — Assessment & Plan Note (Signed)
Seeing gynecologist

## 2017-01-13 NOTE — Assessment & Plan Note (Signed)
Refer to GI; may need pancreatic enzymes

## 2017-01-13 NOTE — Assessment & Plan Note (Signed)
Discussed limits for men vs women; offered referral to substance abuse counselor

## 2017-01-13 NOTE — Assessment & Plan Note (Addendum)
Check labs; alcohol can cause malabsorption, also check thyroid, celiac; after labs back, consider adding pancreatic enzymes if all is normal

## 2017-01-13 NOTE — Assessment & Plan Note (Signed)
I am here to help; proud of her efforts to cut down

## 2017-01-13 NOTE — Patient Instructions (Addendum)
Let's get labs today   Alcohol Use Disorder Alcohol use disorder is when your drinking disrupts your daily life. When you have this condition, you drink too much alcohol and you cannot control your drinking. Alcohol use disorder can cause serious problems with your physical health. It can affect your brain, heart, liver, pancreas, immune system, stomach, and intestines. Alcohol use disorder can increase your risk for certain cancers and cause problems with your mental health, such as depression, anxiety, psychosis, delirium, and dementia. People with this disorder risk hurting themselves and others. What are the causes? This condition is caused by drinking too much alcohol over time. It is not caused by drinking too much alcohol only one or two times. Some people with this condition drink alcohol to cope with or escape from negative life events. Others drink to relieve pain or symptoms of mental illness. What increases the risk? You are more likely to develop this condition if:  You have a family history of alcohol use disorder.  Your culture encourages drinking to the point of intoxication, or makes alcohol easy to get.  You had a mood or conduct disorder in childhood.  You have been a victim of abuse.  You are an adolescent and: ? You have poor grades or difficulties in school. ? Your caregivers do not talk to you about saying no to alcohol, or supervise your activities. ? You are impulsive or you have trouble with self-control.  What are the signs or symptoms? Symptoms of this condition include:  Drinkingmore than you want to.  Drinking for longer than you want to.  Trying several times to drink less or to control your drinking.  Spending a lot of time getting alcohol, drinking, or recovering from drinking.  Craving alcohol.  Having problems at work, at school, or at home due to drinking.  Having problems in relationships due to drinking.  Drinking when it is dangerous to  drink, such as before driving a car.  Continuing to drink even though you know you might have a physical or mental problem related to drinking.  Needing more and more alcohol to get the same effect you want from the alcohol (building up tolerance).  Having symptoms of withdrawal when you stop drinking. Symptoms of withdrawal include: ? Fatigue. ? Nightmares. ? Trouble sleeping. ? Depression. ? Anxiety. ? Fever. ? Seizures. ? Severe confusion. ? Feeling or seeing things that are not there (hallucinations). ? Tremors. ? Rapid heart rate. ? Rapid breathing. ? High blood pressure.  Drinking to avoid symptoms of withdrawal.  How is this diagnosed? This condition is diagnosed with an assessment. Your health care provider may start the assessment by asking three or four questions about your drinking. Your health care provider may perform a physical exam or do lab tests to see if you have physical problems resulting from alcohol use. She or he may refer you to a mental health professional for evaluation. How is this treated? Some people with alcohol use disorder are able to reduce their alcohol use to low-risk levels. Others need to completely quit drinking alcohol. When necessary, mental health professionals with specialized training in substance use treatment can help. Your health care provider can help you decide how severe your alcohol use disorder is and what type of treatment you need. The following forms of treatment are available:  Detoxification. Detoxification involves quitting drinking and using prescription medicines within the first week to help lessen withdrawal symptoms. This treatment is important for people who have  had withdrawal symptoms before and for heavy drinkers who are likely to have withdrawal symptoms. Alcohol withdrawal can be dangerous, and in severe cases, it can cause death. Detoxification may be provided in a home, community, or primary care setting, or in a  hospital or substance use treatment facility.  Counseling. This treatment is also called talk therapy. It is provided by substance use treatment counselors. A counselor can address the reasons you use alcohol and suggest ways to keep you from drinking again or to prevent problem drinking. The goals of talk therapy are to: ? Find healthy activities and ways for you to cope with stress. ? Identify and avoid the things that trigger your alcohol use. ? Help you learn how to handle cravings.  Medicines.Medicines can help treat alcohol use disorder by: ? Decreasing alcohol cravings. ? Decreasing the positive feeling you have when you drink alcohol. ? Causing an uncomfortable physical reaction when you drink alcohol (aversion therapy).  Support groups. Support groups are led by people who have quit drinking. They provide emotional support, advice, and guidance.  These forms of treatment are often combined. Some people with this condition benefit from a combination of treatments provided by specialized substance use treatment centers. Follow these instructions at home:  Take over-the-counter and prescription medicines only as told by your health care provider.  Check with your health care provider before starting any new medicines.  Ask friends and family members not to offer you alcohol.  Avoid situations where alcohol is served, including gatherings where others are drinking alcohol.  Create a plan for what to do when you are tempted to use alcohol.  Find hobbies or activities that you enjoy that do not include alcohol.  Keep all follow-up visits as told by your health care provider. This is important. How is this prevented?  If you drink, limit alcohol intake to no more than 1 drink a day for nonpregnant women and 2 drinks a day for men. One drink equals 12 oz of beer, 5 oz of wine, or 1 oz of hard liquor.  If you have a mental health condition, get treatment and support.  Do not  give alcohol to adolescents.  If you are an adolescent: ? Do not drink alcohol. ? Do not be afraid to say no if someone offers you alcohol. Speak up about why you do not want to drink. You can be a positive role model for your friends and set a good example for those around you by not drinking alcohol. ? If your friends drink, spend time with others who do not drink alcohol. Make new friends who do not use alcohol. ? Find healthy ways to manage stress and emotions, such as meditation or deep breathing, exercise, spending time in nature, listening to music, or talking with a trusted friend or family member. Contact a health care provider if:  You are not able to take your medicines as told.  Your symptoms get worse.  You return to drinking alcohol (relapse) and your symptoms get worse. Get help right away if:  You have thoughts about hurting yourself or others. If you ever feel like you may hurt yourself or others, or have thoughts about taking your own life, get help right away. You can go to your nearest emergency department or call:  Your local emergency services (911 in the [place]  A suicide crisis helpline, such as the National Suicide Prevention Lifeline at 629 485 4292. This is open 24 hours a day.  Summary  Alcohol use disorder is when your drinking disrupts your daily life. When you have this condition, you drink too much alcohol and you cannot control your drinking.  Treatment may include detoxification, counseling, medicine, and support groups.  Ask friends and family members not to offer you alcohol. Avoid situations where alcohol is served.  Get help right away if you have thoughts about hurting yourself or others. This information is not intended to replace advice given to you by your health care provider. Make sure you discuss any questions you have with your health care provider. Document Released: 09/01/2004 Document Revised: 04/21/2016 Document Reviewed:  04/21/2016 Elsevier Interactive Patient Education  [employer]2018 Elsevier Inc.

## 2017-01-13 NOTE — Progress Notes (Signed)
BP 124/86 (BP Location: Left Arm, Patient Position: Sitting, Cuff Size: Normal)   Pulse 94   Temp 98 F (36.7 C) (Oral)   Resp 16   Ht _0  (1.702 m)   Wt 131 lb 4.8 oz (59.6 kg)   SpO2 92%   BMI 20.56 kg/m    Subjective:    Patient ID: Meghan Rivers, female    DOB: [birth year], 66 y.o.   MRN: [id number]  HPI: Meghan Rivers is a 66 y.o. female  Chief Complaint  Patient presents with  . Follow-up    3 month    HPI  She drank two small V8 juices and had swelling in both legs; no SHOB, no CP; no other symptoms; no pitting; pedal pulses, nothing but fluid; resolved; watching salt; not adding salt at the table Mood is doing well She had lost 5 pounds; everything goes right through her Drinking boost and ensure, but gets runny stools; everything she eats goes through her; she is not eating much; she ate greasy fish and it caused diarrhea for days; she lost her normal flora, then got probiotics, but taking sporadically; has not really checked to see if  Discussed possibility of cancer; no cough, no night sweats; feet hurt, but nothing worrisome there stooling every time she eats; 5.5 to 6 Drinking alcohol, quit all brandy; two beers yesterday and three [place] iced teas Smoking 7 cigarettes a day CT scan of chest done January 2018 She decreased her metoprolol High cholesterol; on statin  Lab Results  Component Value Date   CHOL 172 11/16/2015   CHOL 146 05/11/2015   Lab Results  Component Value Date   HDL 100 11/16/2015   HDL 62 05/11/2015   Lab Results  Component Value Date   LDLCALC 35 11/16/2015   [place] 63 05/11/2015   Lab Results  Component Value Date   TRIG 184 (H) 11/16/2015   TRIG 104 05/11/2015   No results found for: CHOLHDL No results found for: LDLDIRECT   Depression screen [facility] 2/9 01/13/2017 10/07/2016 06/22/2016 11/16/2015 07/08/2015  Decreased Interest 0 0 0 0 0  Down, Depressed, Hopeless 0 0 0 0 0  PHQ - 2 Score 0 0 0 0 0    Relevant  past medical, surgical, family and social history reviewed Past Medical History:  Diagnosis Date  . Anxiety   . Depression   . Elevated liver enzymes   . Emphysema lung ([place]) 10/07/2016  . Essential hypertension   . Excessive drinking of alcohol   . Hyperlipidemia   . Hyponatremia   . Hypothyroidism   . Macrocytosis   . Mild Mitral and Tricuspid Regurgitation    a. 08/2014 Echo: EF 60-65%, mild MR/TR.  . Mild reactive airways disease   . Orthostasis    a. 08/2014->diuretics held.  . Osteoporosis   . Personal history of tobacco use, presenting hazards to health 07/10/2015  . Tobacco use   . Vulvar intraepithelial neoplasia II    Past Surgical History:  Procedure Laterality Date  . ABDOMINAL HYSTERECTOMY     complete, dermoid tumor left ovary  . APPENDECTOMY    . AUGMENTATION MAMMAPLASTY Bilateral 1990  . BREAST ENHANCEMENT SURGERY    . SEPTOPLASTY     Family History  Problem Relation Age of Onset  . Heart attack Father 55  . Hyperlipidemia Father   . Hypertension Father   . Heart disease Father   . Heart failure Father   . Stroke Father   .  Kidney disease Father   . Emphysema Father   . COPD Father   . Heart disease Brother   . Heart attack Brother 21  . CAD Brother   . Cancer Mother        breast  . Breast cancer Mother 9  . Diverticulitis Maternal Grandmother   . Diabetes Neg Hx    Social History   Social History  . Marital status: Widowed    Spouse name: N/A  . Number of children: N/A  . Years of education: N/A   Occupational History  . Not on file.   Social History Main Topics  . Smoking status: Current Every Day Smoker    Packs/day: 0.25    Years: 50.00    Types: Cigarettes  . Smokeless tobacco: Never Used     Comment: 7-8 cigs per day   . Alcohol use 6.0 oz/week    3 Cans of beer, 7 Shots of liquor per week     Comment: 2-3 Beers a day, 2 mixed drinks a day  . Drug use: No     Comment: History of marijuana use  . Sexual activity: No    Other Topics Concern  . Not on file   Social History Narrative  . No narrative on file   Interim medical history since last visit reviewed. Allergies and medications reviewed  Review of Systems Per HPI unless specifically indicated above     Objective:    BP 124/86 (BP Location: Left Arm, Patient Position: Sitting, Cuff Size: Normal)   Pulse 94   Temp 98 F (36.7 C) (Oral)   Resp 16   Ht _0  (1.702 m)   Wt 131 lb 4.8 oz (59.6 kg)   SpO2 92%   BMI 20.56 kg/m   Wt Readings from Last 3 Encounters:  01/13/17 131 lb 4.8 oz (59.6 kg)  12/20/16 132 lb 4.8 oz (60 kg)  10/07/16 140 lb 6.4 oz (63.7 kg)    Physical Exam  Constitutional: She appears well-developed and well-nourished.  HENT:  Mouth/Throat: Oropharynx is clear and moist and mucous membranes are normal.  Eyes: EOM are normal. No scleral icterus.  Neck: No thyromegaly present.  Cardiovascular: Normal rate and regular rhythm.   Pulmonary/Chest: Effort normal and breath sounds normal.  Abdominal: Bowel sounds are normal. She exhibits no distension.  Musculoskeletal: She exhibits no edema.  Neurological: She is alert.  Skin: No pallor.  Palmar erythema  Psychiatric: She has a normal mood and affect. Her behavior is normal. Her mood appears not anxious. She does not exhibit a depressed mood.   Results for orders placed or performed in visit on 10/07/16  CBC with Differential/Platelet  Result Value Ref Range   WBC 8.3 3.8 - 10.8 K/uL   RBC 4.40 3.80 - 5.10 MIL/uL   Hemoglobin 14.4 11.7 - 15.5 g/dL   HCT 43.7 35.0 - 45.0 %   MCV 99.3 80.0 - 100.0 fL   MCH 32.7 27.0 - 33.0 pg   MCHC 33.0 32.0 - 36.0 g/dL   RDW 14.5 11.0 - 15.0 %   Platelets 256 140 - 400 K/uL   MPV 10.2 7.5 - 12.5 fL   Neutro Abs 5,146 1,500 - 7,800 cells/uL   Lymphs Abs 2,241 850 - 3,900 cells/uL   Monocytes Absolute 581 200 - 950 cells/uL   Eosinophils Absolute 332 15 - 500 cells/uL   Basophils Absolute 0 0 - 200 cells/uL   Neutrophils  Relative % 62 %  Lymphocytes Relative 27 %   Monocytes Relative 7 %   Eosinophils Relative 4 %   Basophils Relative 0 %   Smear Review Criteria for review not met   COMPLETE METABOLIC PANEL WITH GFR  Result Value Ref Range   Sodium 139 135 - 146 mmol/L   Potassium 3.8 3.5 - 5.3 mmol/L   Chloride 103 98 - 110 mmol/L   CO2 27 20 - 31 mmol/L   Glucose, Bld 87 65 - 99 mg/dL   BUN 3 (L) 7 - 25 mg/dL   Creat 0.55 0.50 - 0.99 mg/dL   Total Bilirubin 0.6 0.2 - 1.2 mg/dL   Alkaline Phosphatase 103 33 - 130 U/L   AST 41 (H) 10 - 35 U/L   ALT 16 6 - 29 U/L   Total Protein 6.1 6.1 - 8.1 g/dL   Albumin 3.4 (L) 3.6 - 5.1 g/dL   Calcium 8.6 8.6 - 10.4 mg/dL   GFR, Est African American >89 >=60 mL/min   GFR, Est Non African American >89 >=60 mL/min  TSH  Result Value Ref Range   TSH 1.41 mIU/L      Assessment & Plan:   Problem List Items Addressed This Visit      Cardiovascular and Mediastinum   Carotid stenosis (Chronic)    Suggested starting back on baby aspirin daily, but stop and call if any bruising or bleeding      Relevant Orders   Lipid panel     Respiratory   Emphysema lung ([facility])    Asymptomatic, encouraged smoking cessation        Genitourinary   VAIN I (vaginal intraepithelial neoplasia grade I)    Seeing gynecologist        Other   Tobacco use    I am here to help; proud of her efforts to cut down      Right shoulder injury    resolved      Excessive drinking of alcohol (Chronic)    Discussed limits for men vs women; offered referral to substance abuse counselor      Relevant Orders   COMPLETE METABOLIC PANEL WITH GFR   CBC with Differential/Platelet   B12   Folate   Diarrhea - Primary    Check labs; alcohol can cause malabsorption, also check thyroid, celiac; after labs back, consider adding pancreatic enzymes if all is normal      Relevant Orders   Gastrointestinal Pathogen Panel PCR   Celiac Disease Comprehensive Panel with Reflexes      Other Visit Diagnoses    Weight loss       Relevant Orders   TSH       Follow up plan: Return in about 3 months (around 04/15/2017) for follow-up visit with Dr. Sanda Klein.  An after-visit summary was printed and given to the patient at [place].  Please see the patient instructions which may contain other information and recommendations beyond what is mentioned above in the assessment and plan.  Meds ordered this encounter  Medications  . Probiotic Product (ALOE 79892 & PROBIOTICS PO)    Sig: Take by mouth.    Orders Placed This Encounter  Procedures  . Gastrointestinal Pathogen Panel PCR  . Celiac Disease Comprehensive Panel with Reflexes  . TSH  . COMPLETE METABOLIC PANEL WITH GFR  . CBC with Differential/Platelet  . B12  . Folate  . Lipid panel

## 2017-01-13 NOTE — Assessment & Plan Note (Signed)
resolved 

## 2017-01-13 NOTE — Assessment & Plan Note (Signed)
Suggested starting back on baby aspirin daily, but stop and call if any bruising or bleeding

## 2017-01-14 LAB — FOLATE: FOLATE: 8.3 ng/mL (ref >5.4)

## 2017-01-14 LAB — VITAMIN B12: Vitamin B-12: >2000 pg/mL — ABNORMAL HIGH (ref 200–1100)

## 2017-01-16 ENCOUNTER — Other Ambulatory Visit: Payer: Self-pay | Admitting: Family Medicine

## 2017-01-16 ENCOUNTER — Telehealth: Payer: Self-pay

## 2017-01-16 DIAGNOSIS — R634 Abnormal weight loss: Secondary | ICD-10-CM | POA: Diagnosis not present

## 2017-01-16 DIAGNOSIS — R69 Illness, unspecified: Secondary | ICD-10-CM | POA: Diagnosis not present

## 2017-01-16 DIAGNOSIS — R197 Diarrhea, unspecified: Secondary | ICD-10-CM | POA: Diagnosis not present

## 2017-01-16 DIAGNOSIS — K909 Intestinal malabsorption, unspecified: Secondary | ICD-10-CM | POA: Diagnosis not present

## 2017-01-16 NOTE — Telephone Encounter (Signed)
I called to inform this patient that she has been scheduled to have her ultrasound on 01/17/17 at  8am [facility] but she stated that someone from scheduling has already contacted her.   Patient was reminded not to have anything to eat or drink 6 hours prior to this procedure.  Patient expressed verbal understanding and said thanks.

## 2017-01-17 ENCOUNTER — Telehealth: Payer: Self-pay

## 2017-01-17 ENCOUNTER — Ambulatory Visit
Admission: RE | Admit: 2017-01-17 | Discharge: 2017-01-17 | Disposition: A | Discharge status: Home / Self Care | Payer: Medicare HMO | Source: Ambulatory Visit | Attending: Family Medicine | Admitting: Family Medicine

## 2017-01-17 ENCOUNTER — Other Ambulatory Visit: Payer: Self-pay | Admitting: Family Medicine

## 2017-01-17 DIAGNOSIS — R7401 Elevation of levels of liver transaminase levels: Secondary | ICD-10-CM

## 2017-01-17 DIAGNOSIS — F1010 Alcohol abuse, uncomplicated: Secondary | ICD-10-CM

## 2017-01-17 DIAGNOSIS — R740 Nonspecific elevation of levels of transaminase and lactic acid dehydrogenase [LDH]: Secondary | ICD-10-CM | POA: Insufficient documentation

## 2017-01-17 DIAGNOSIS — E8809 Other disorders of plasma-protein metabolism, not elsewhere classified: Secondary | ICD-10-CM | POA: Insufficient documentation

## 2017-01-17 DIAGNOSIS — K7689 Other specified diseases of liver: Secondary | ICD-10-CM | POA: Diagnosis not present

## 2017-01-17 DIAGNOSIS — K828 Other specified diseases of gallbladder: Secondary | ICD-10-CM | POA: Diagnosis not present

## 2017-01-17 DIAGNOSIS — R69 Illness, unspecified: Secondary | ICD-10-CM | POA: Diagnosis not present

## 2017-01-17 DIAGNOSIS — R197 Diarrhea, unspecified: Principal | ICD-10-CM

## 2017-01-17 DIAGNOSIS — K909 Intestinal malabsorption, unspecified: Secondary | ICD-10-CM

## 2017-01-17 LAB — GASTROINTESTINAL PATHOGEN PANEL PCR

## 2017-01-17 LAB — CELIAC DISEASE COMPREHENSIVE PANEL WITH REFLEXES
IgA: 288 mg/dL (ref 81–463)
Tissue Transglutaminase Ab, IgA: 1 U/mL (ref <4)

## 2017-01-17 NOTE — Telephone Encounter (Signed)
That should be fine, thinks [place]

## 2017-01-17 NOTE — Telephone Encounter (Signed)
Please let patient know that Dr. Mariah MillingGollan agreed with cutting down the atorvastatin to 10 mg at night She can cut the 20 mg pill in half and use up what she has; when she gets down to needing refill, just call and I'll send in the new lower dose so she doesn't have to keep cutting them

## 2017-01-17 NOTE — Telephone Encounter (Signed)
Gastrointestinal GI panel Test code [id number] was ordered but no stool was sent. [facility] no [facility] was received, please recollect and sent back in with a new test order.

## 2017-01-18 NOTE — Telephone Encounter (Signed)
Patient notified but wanted me to relay the message that she does not feel bad as the bloodwork is showing and thinks it may be incorrect?

## 2017-01-18 NOTE — Telephone Encounter (Signed)
I believe the lab results in this case I still recommend the evaluation with the liver specialist and other recommendations

## 2017-01-19 LAB — GASTROINTESTINAL PATHOGEN PANEL PCR
C. difficile Tox A/B, PCR: NOT DETECTED
Campylobacter, PCR: NOT DETECTED
Cryptosporidium, PCR: NOT DETECTED
E COLI (ETEC) LT/ST, PCR: NOT DETECTED
E COLI (STEC) STX1/STX2, PCR: NOT DETECTED
E coli 0157, PCR: NOT DETECTED
Giardia lamblia, PCR: NOT DETECTED
Norovirus, PCR: NOT DETECTED
ROTAVIRUS, PCR: NOT DETECTED
SALMONELLA, PCR: NOT DETECTED
Shigella, PCR: NOT DETECTED

## 2017-01-31 ENCOUNTER — Encounter: Payer: Self-pay | Admitting: Gastroenterology

## 2017-01-31 ENCOUNTER — Ambulatory Visit (INDEPENDENT_AMBULATORY_CARE_PROVIDER_SITE_OTHER): Payer: Medicare HMO | Admitting: Gastroenterology

## 2017-01-31 VITALS — BP 108/76 | HR 82 | Temp 98.0°F | Ht 67.0 in | Wt 131.4 lb

## 2017-01-31 DIAGNOSIS — F1010 Alcohol abuse, uncomplicated: Secondary | ICD-10-CM

## 2017-01-31 DIAGNOSIS — R634 Abnormal weight loss: Secondary | ICD-10-CM

## 2017-01-31 DIAGNOSIS — Z720 Tobacco use: Secondary | ICD-10-CM

## 2017-01-31 DIAGNOSIS — R69 Illness, unspecified: Secondary | ICD-10-CM | POA: Diagnosis not present

## 2017-01-31 NOTE — Progress Notes (Signed)
Wyline MoodKiran Jairy Angulo MD, MRCP(U.K) [address]  [address]  [place], [place] [address]  Main: [phone]  Fax: [phone]   Gastroenterology Consultation  Referring Provider:     Kerman PasseyLada, Melinda P, MD Primary Care Physician:  Kerman PasseyLada, Melinda P, MD Primary Gastroenterologist:  Dr. Wyline MoodKiran Lysle Yero  Reason for Consultation:     Pancreatitis , cirrhosis of the liver         HPI:   Meghan Rivers is a 66 y.o. y/o female referred for consultation & management  by Dr. Sherie DonLada, Janit BernMelinda P, MD.     LFT's-Tbil 1.4 , AST 97, ALT 24 , albumin 3.1  Hb 12.6 , mcv 99 , plt 222  TSH- very low Celiac serology negative  RUQ USG 01/2017 - distended gall bladder with sludge , no stones, fatty infiltration of the liver .  She says she is here for a loss of apetite- Says been 2 months- no particular reason, not much stress. , not getting hungry. Lost some weight 3 lbs over a few months.   Denies any abdominal pain. Occasional diarrhea depending on the food she eats. Not very often . Last colonoscopy many years back, no endoscopy .   Drinks alcohol - 3 beers a day , [place] ice tea alternate with the beers. Has been drinking for many years. No sweeteners in her whisky. Smokes 7-10 cigarettes a day .   Past Medical History:  Diagnosis Date  . Anxiety   . Depression   . Elevated liver enzymes   . Emphysema lung (HCC) 10/07/2016  . Essential hypertension   . Excessive drinking of alcohol   . Hyperlipidemia   . Hyponatremia   . Hypothyroidism   . Macrocytosis   . Mild Mitral and Tricuspid Regurgitation    a. 08/2014 Echo: EF 60-65%, mild MR/TR.  . Mild reactive airways disease   . Orthostasis    a. 08/2014->diuretics held.  . Osteoporosis   . Personal history of tobacco use, presenting hazards to health 07/10/2015  . Tobacco use   . Vulvar intraepithelial neoplasia II     Past Surgical History:  Procedure Laterality Date  . ABDOMINAL HYSTERECTOMY     complete, dermoid tumor left ovary  .  APPENDECTOMY    . AUGMENTATION MAMMAPLASTY Bilateral 1990  . BREAST ENHANCEMENT SURGERY    . SEPTOPLASTY      Prior to Admission medications   Medication Sig Start Date End Date Taking? Authorizing Provider  albuterol (PROVENTIL HFA;VENTOLIN HFA) 108 (90 BASE) MCG/ACT inhaler Inhale into the lungs every 6 (six) hours as needed for wheezing or shortness of breath.   Yes [provider]  atorvastatin (LIPITOR) 20 MG tablet Take 0.5 tablets (10 mg total) by mouth daily. 01/13/17  Yes Lada, Janit BernMelinda P, MD  busPIRone (BUSPAR) 10 MG tablet Take 1 tablet (10 mg total) by mouth 2 (two) times daily as needed. Patient taking differently: Take 1 tablet (10 mg total) by mouth prn 07/27/15  Yes Lada, Janit BernMelinda P, MD  cyanocobalamin 500 MCG tablet Take 500 mcg by mouth 2 (two) times daily.   Yes [provider]  folic acid (FOLVITE) 400 MCG tablet Take 400 mcg by mouth 2 (two) times daily.   Yes [provider]  levothyroxine (SYNTHROID, LEVOTHROID) 75 MCG tablet 1 Tablet once each day ORAL 03/30/16  Yes Lada, Janit BernMelinda P, MD  metoprolol succinate (TOPROL-XL) 50 MG 24 hr tablet Take 1 tablet (50 mg total) by mouth daily. Take with or immediately  f ollowing a meal. 01/09/17  Yes Gollan, Tollie Pizza, MD  mirtazapine (REMERON) 15 MG tablet Take 0.5 tablets (7.5 mg total) by mouth at bedtime. 12/21/15  Yes Lada, Janit Bern, MD  potassium chloride SA (K-DUR,KLOR-CON) 20 MEQ tablet Take 1 tablet (20 mEq total) by mouth 2 (two) times daily. Patient not taking: Reported on 01/31/2017 01/13/17   Kerman Passey, MD  Probiotic Product (ALOE 16109 & PROBIOTICS PO) Take by mouth.    [provider]    Family History  Problem Relation Age of Onset  . Heart attack Father 86  . Hyperlipidemia Father   . Hypertension Father   . Heart disease Father   . Heart failure Father   . Stroke Father   . Kidney disease Father   . Emphysema Father   . COPD Father   . Heart disease Brother   . Heart  attack Brother 109  . CAD Brother   . Cancer Mother        breast  . Breast cancer Mother 32  . Diverticulitis Maternal Grandmother   . Diabetes Neg Hx      Social History  Substance Use Topics  . Smoking status: Current Every Day Smoker    Packs/day: 0.25    Years: 50.00    Types: Cigarettes  . Smokeless tobacco: Never Used     Comment: 7-8 cigs per day   . Alcohol use 6.0 oz/week    3 Cans of beer, 7 Shots of liquor per week     Comment: 2-3 Beers a day, 2 mixed drinks a day    Allergies as of 01/31/2017 - Review Complete 01/31/2017  Allergen Reaction Noted  . Benazepril  09/17/2014  . Erythromycin  09/17/2014    Review of Systems:    All systems reviewed and negative except where noted in HPI.   Physical Exam:  BP 108/76 (BP Location: Left Arm, Patient Position: Sitting, Cuff Size: Normal)   Pulse 82   Temp 98 F (36.7 C) (Oral)   Ht 5\' 7"  (1.702 m)   Wt 131 lb 6.4 oz (59.6 kg)   BMI 20.58 kg/m  No LMP recorded. Patient has had a hysterectomy. Psych:  Alert and cooperative. Normal mood and affect. General:   Alert,  Well-developed, well-nourished, pleasant and cooperative in NAD Head:  Normocephalic and atraumatic. Eyes:  Sclera clear, no icterus.   Conjunctiva pink. Ears:  Normal auditory acuity. Nose:  No deformity, discharge, or lesions. Mouth:  No deformity or lesions,oropharynx pink & moist. Neck:  Supple; no masses or thyromegaly. Lungs:  Respirations even and unlabored.  Clear throughout to auscultation.   No wheezes, crackles, or rhonchi. No acute distress. Heart:  Regular rate and rhythm; no murmurs, clicks, rubs, or gallops. Abdomen:  Normal bowel sounds.  No bruits.  Soft, non-tender and non-distended without masses, hepatosplenomegaly or hernias noted.  No guarding or rebound tenderness.    Msk:  Symmetrical without gross deformities. Good, equal movement & strength bilaterally. Pulses:  Normal pulses noted. Extremities:  No clubbing or edema.  No  cyanosis. Neurologic:  Alert and oriented x3;  grossly normal neurologically. Skin: bruising ove rarms  Psych:  Alert and cooperative. Normal mood and affect.  Imaging Studies: US Abdomen Limited Ruq  Result Date: 01/17/2017 CLINICAL DATA:  Elevated liver function studies, elevated bilirubin, decreased of view mid, excessive alcohol consumption. Known hepatic cysts. EXAM: ULTRASOUND ABDOMEN LIMITED RIGHT UPPER QUADRANT COMPARISON:  MRI of the liver Dec 08, 2015 and  limited right upper quadrant ultrasound of July 23, 2015. FINDINGS: Gallbladder: The gallbladder is adequately distended and filled with echogenic bile or sludge. No discrete stones are observed. There is no gallbladder wall thickening, pericholecystic fluid, or positive sonographic Murphy's sign. Common bile duct: Diameter: 3.2 mm.  No intraluminal stones or sludge are observed. Liver: The hepatic echotexture is mildly increased diffusely. There is a hypoechoic focus in the right hepatic lobe near the dome which is stable and measures 1.1 cm in greatest dimension. A previously demonstrated cyst in the left hepatic lobe is not visible on today's study. There is no intrahepatic ductal dilation. The surface contour of the liver remains smooth. IMPRESSION: The gallbladder is moderately distended with sludge. No stones or sonographic evidence of acute cholecystitis. Increased hepatic echotexture likely reflects fatty infiltrative change though other infiltrative processes including cirrhosis are not excluded. No suspicious hepatic masses. One previously described right lobe cystic lesion is observed and is stable. A previously demonstrated left lobe cystic lesion is not evident on today's study. Electronically Signed   By: David  [place] M.D.   On: 01/17/2017 08:39    Assessment and Plan:   Jacob Chamblee is a 66 y.o. y/o female has been referred for chronic pancreatitis, abnormal LFT's . Alcohol abuse.   I feel a large portion of her  problems are due to excess alcohol intake and smoking . Advised hert to stop both .   She has lost some weight 3 lbs - advised colonoscopy as per regular recommendations for colorectal cancer screening which she is not very keen.  Advised if she continues to lose weight may need imaging of her chest/abdomen and pelvis due to history of smoking  In addition to endoscopy.   In terms of her abnormal LFT's , it is very likely secondary to alcohol intake , I did offer her to screen for viral and autoimmune hepatitis but she is not interested.   She will think about my recommendations and return in 3 months if she decides to pursue any testing .   She said her diarrhea was not an issue presently.   Follow up in 3 months.   Dr Wyline Mood MD,MRCP(U.K)

## 2017-04-03 ENCOUNTER — Other Ambulatory Visit: Payer: Self-pay | Admitting: Family Medicine

## 2017-04-03 DIAGNOSIS — E038 Other specified hypothyroidism: Secondary | ICD-10-CM

## 2017-04-03 NOTE — Telephone Encounter (Signed)
Patient's last TSH was abnormal. I sent two notes through MyChart. Please ask Meghan Rivers to come in this week for a TSH (preferably before the end of the day Thursday so we'll have results before I leave). I may need to adjust Meghan Rivers medicine. I'll give a few days of the 75 mcg strength to get Meghan Rivers through, but we'll want to make sure she's on the right dose. Thank you

## 2017-04-03 NOTE — Assessment & Plan Note (Signed)
Abnormal tsh; recheck, adjust dose if needed

## 2017-04-04 NOTE — Telephone Encounter (Signed)
Left a detail message. Asking the pt to come in. Also gave  the times she can come in and express the important's for her to come in before Friday august 31st.

## 2017-04-05 ENCOUNTER — Telehealth: Payer: Self-pay | Admitting: Family Medicine

## 2017-04-05 NOTE — Telephone Encounter (Signed)
Pt received a call stating that Dr Sherie DonLada was changing her levothyroxine to 75 mcg. Pt wanted you to know that she has been on this strength for months and is needing clarification

## 2017-04-05 NOTE — Telephone Encounter (Signed)
Pt was thinking that Dr. lada was going to increase her dosage for her thyroid. I Explain to pt she is asking her to come in tomorrow to get TSH drawn since her last TSH was abnormal. This will allow Dr. lada to see if a dosage change is needed before she leaves for her trip. Pt understood to still take the dosage she has now until further notice. Pt understood and will come in tomorrow morning to have them drawn.

## 2017-04-06 ENCOUNTER — Other Ambulatory Visit: Payer: Self-pay

## 2017-04-06 DIAGNOSIS — E038 Other specified hypothyroidism: Secondary | ICD-10-CM

## 2017-04-07 ENCOUNTER — Other Ambulatory Visit: Payer: Self-pay | Admitting: Family Medicine

## 2017-04-07 LAB — TSH: TSH: 1.95 m[IU]/L

## 2017-04-07 MED ORDER — LEVOTHYROXINE SODIUM 75 MCG PO TABS: 75.0000 ug | ORAL_TABLET | Freq: Every day | ORAL | 3 refills | Status: AC

## 2017-04-07 NOTE — Progress Notes (Signed)
Normal TSH; refills sent 

## 2017-04-21 IMAGING — MR MR ABDOMEN WO/W CM
16 of 17 series · 44 of 48 positions shown · IV contrast (12ML MULTIHANCE)
Comparison: CT 07/10/2015, ultrasound 07/23/2015

CLINICAL DATA: Indeterminate hepatic lesions on ultrasound CT.

EXAM:
MRI ABDOMEN WITHOUT AND WITH CONTRAST
TECHNIQUE: Multiplanar multisequence MR imaging of the abdomen was performed
both before and after the administration of intravenous contrast.
CONTRAST:  15mL MULTIHANCE GADOBENATE DIMEGLUMINE 529 MG/ML IV SOLN

[Series 2: cor ssfse / · coronal · 7.0mm · 1.48mm/px · 2 of 30 slices shown]
[im 1/30]
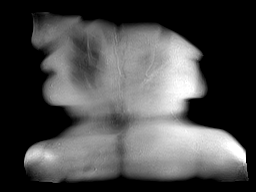
[im 30/30]
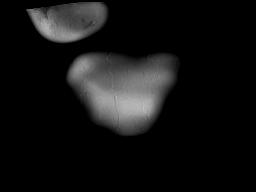

[Series 3: T2 fat-sat · axial · 6.0mm · 1.48mm/px · z∈[-137,+94]mm · 2 of 33 slices shown]
[im 1/33]
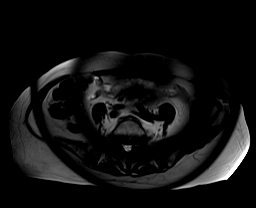
[im 33/33]
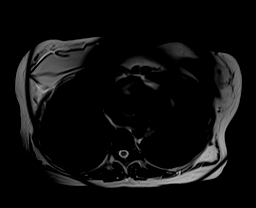

[Series 5: DWI · axial · 6.0mm · 2.00mm/px · z∈[-144,+101]mm · 6 of 105 slices shown (1 of 2)]
[im 1/105]
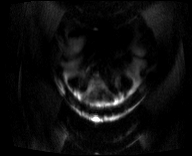
[im 21/105]
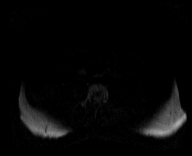
[im 42/105]
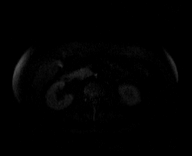
[im 63/105]
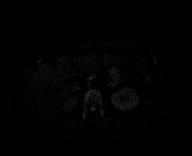
[im 84/105]
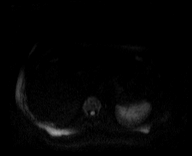
[im 105/105]
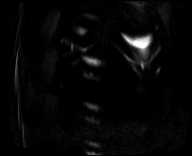

[Series 6: DWI · axial · 6.0mm · 2.00mm/px · z∈[-144,+101]mm · 3 of 35 slices shown (2 of 2)]
[im 1/35]
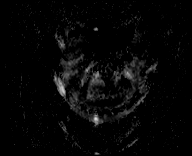
[im 18/35]
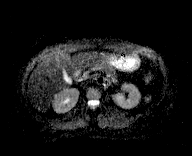
[im 35/35]
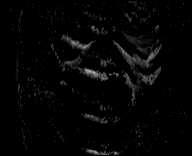

[Series 7: bSSFP · axial · 6.0mm · 0.74mm/px · z∈[-137,+94]mm · 2 of 33 slices shown]
[im 1/33]
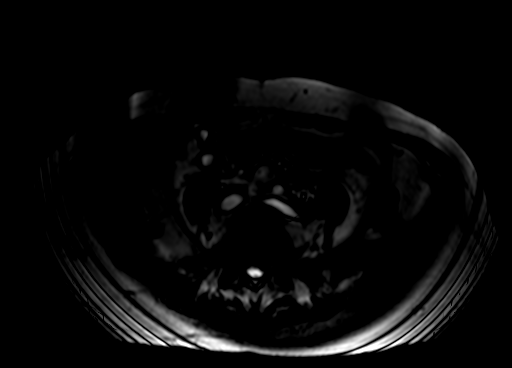
[im 33/33]
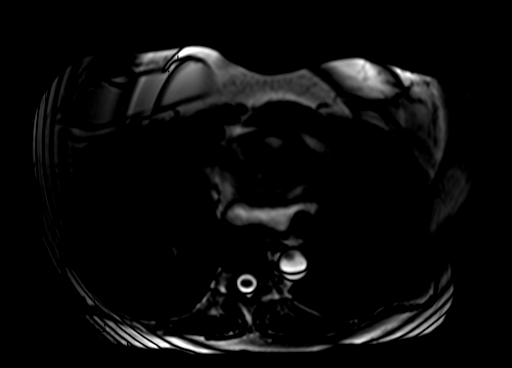

[Series 8: axial dynamic pre · axial · non-contrast · 3.0mm · 1.19mm/px · z∈[-140,+97]mm · 3 of 80 slices shown]
[im 1/80]
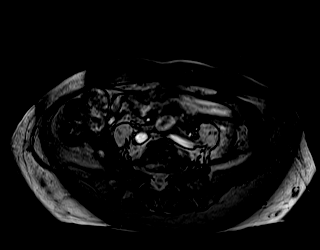
[im 40/80]
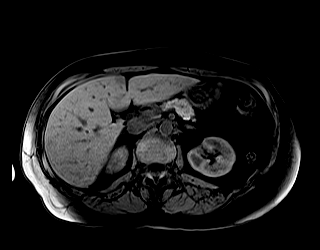
[im 80/80]
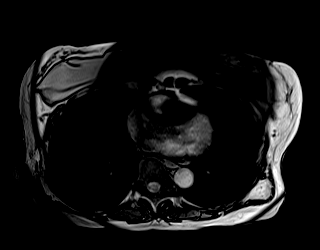

[Series 9: axial dynamic post · axial · 3.0mm · 1.19mm/px · z∈[-140,+97]mm · 3 of 80 slices shown (1 of 3)]
[im 1/80]
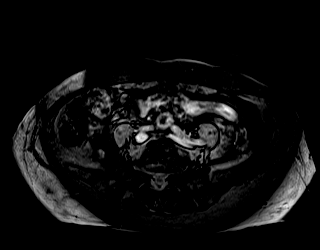
[im 40/80]
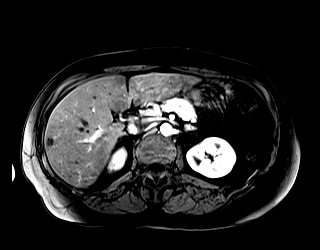
[im 80/80]
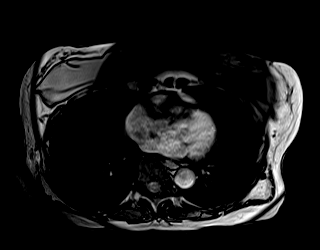

[Series 10: axial dynamic post · axial · 3.0mm · 1.19mm/px · z∈[-140,+97]mm · 3 of 80 slices shown (2 of 3)]
[im 1/80]
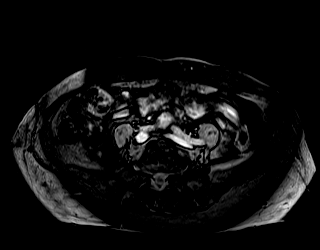
[im 40/80]
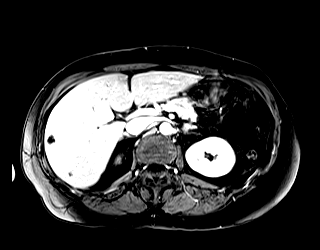
[im 80/80]
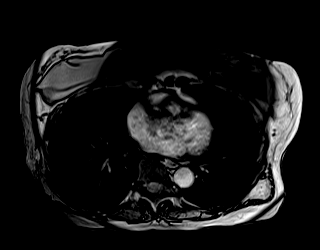

[Series 11: axial dynamic post · axial · 3.0mm · 1.19mm/px · z∈[-140,+97]mm · 3 of 80 slices shown (3 of 3)]
[im 1/80]
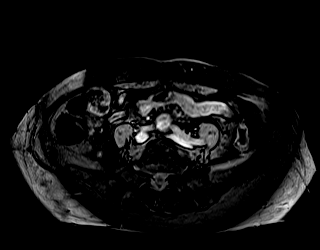
[im 40/80]
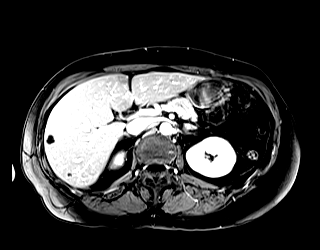
[im 80/80]
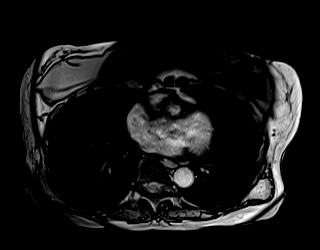

[Series 14: axial dynamic delayed · axial · 3.0mm · 1.19mm/px · z∈[-140,+97]mm · 3 of 80 slices shown]
[im 1/80]
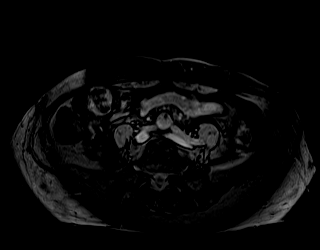
[im 40/80]
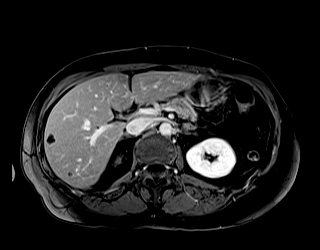
[im 80/80]
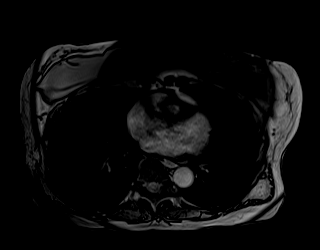

[Series 100: out of phase · axial · 6.0mm · 0.74mm/px · 1 of 33 slices shown]
[im 1/33]
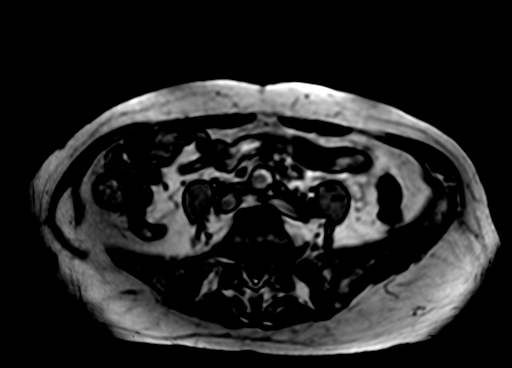

[Series 101: in phase · axial · 6.0mm · 0.74mm/px · 1 of 33 slices shown]
[im 1/33]
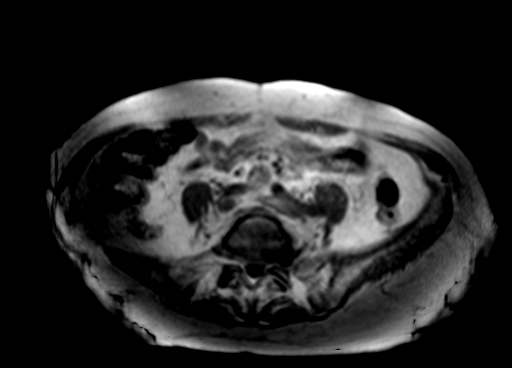

[Series 103: immed sub · axial · 3.0mm · 1.19mm/px · z∈[-140,+97]mm · 3 of 80 slices shown]
[im 1/80]
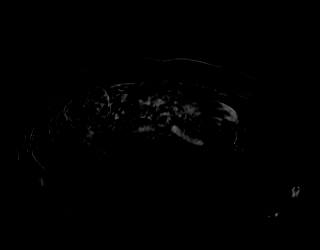
[im 40/80]
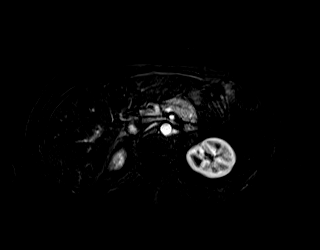
[im 80/80]
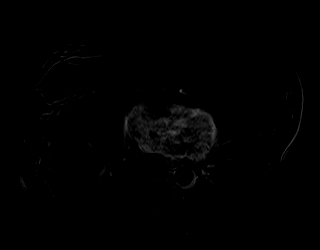

[Series 105: 45 sec sub · axial · 3.0mm · 1.19mm/px · z∈[-140,+97]mm · 3 of 80 slices shown]
[im 1/80]
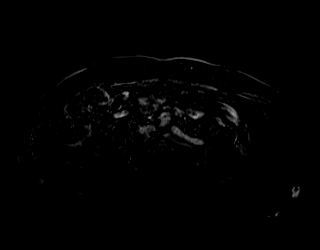
[im 40/80]
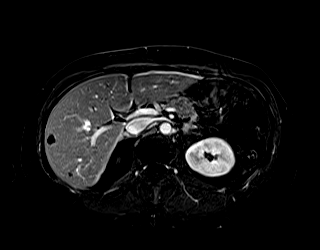
[im 80/80]
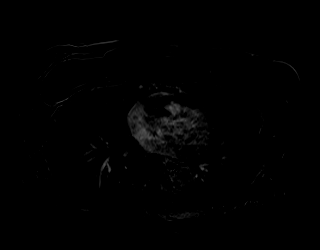

[Series 107: 90 sec sub · axial · 3.0mm · 1.19mm/px · z∈[-140,+97]mm · 3 of 80 slices shown]
[im 1/80]
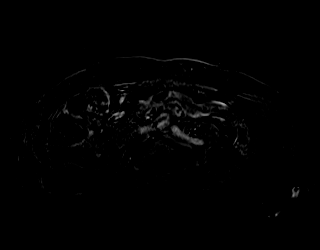
[im 40/80]
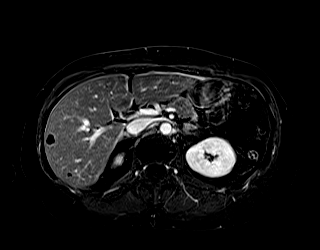
[im 80/80]
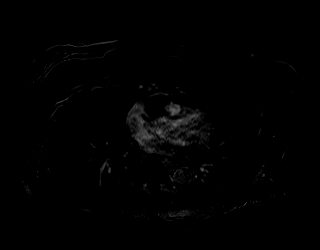

[Series 109: 3 min sub · axial · 3.0mm · 1.19mm/px · z∈[-140,+97]mm · 3 of 80 slices shown]
[im 1/80]
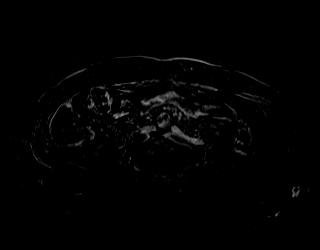
[im 40/80]
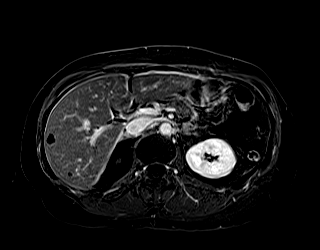
[im 80/80]
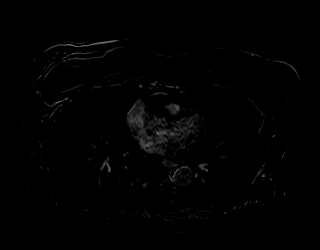

[44 of 48 positions shown; findings below may reference images not displayed]

FINDINGS: Lower chest:  Lung bases are clear.

Hepatobiliary: 15 mm LEFT hepatic lobe cyst and 13 mm RIGHT hepatic
lobe cyst corresponds to the indeterminate lesions on comparison CT
and ultrasound. Several additional smaller hepatic cysts. There is
no biliary duct dilatation. Gallbladder is normal. Common bile duct
normal.

Pancreas: Normal pancreatic parenchymal intensity. No ductal
dilatation or inflammation.

Spleen: Normal spleen.

Adrenals/urinary tract: Adrenal glands normal. Nonenhancing cyst of
the LEFT kidney.

Stomach/Bowel: Stomach and limited of the small bowel is
unremarkable

Vascular/Lymphatic: Abdominal aortic normal caliber. No
retroperitoneal periportal lymphadenopathy.

Musculoskeletal: No aggressive osseous lesion
IMPRESSION: 1. Benign hepatic cysts correspond to the indeterminate lesions on
comparison exams.
2. Benign Bosniak 1 renal cysts of the LEFT kidney.

## 2017-05-03 ENCOUNTER — Ambulatory Visit: Payer: Medicare HMO | Admitting: Gastroenterology

## 2017-05-08 ENCOUNTER — Other Ambulatory Visit: Payer: Self-pay

## 2017-05-08 MED ORDER — MIRTAZAPINE 15 MG PO TABS: 7.5000 mg | ORAL_TABLET | Freq: Every day | ORAL | 5 refills | Status: DC

## 2017-05-08 NOTE — Telephone Encounter (Signed)
Reviewed last WBC; Rx approved

## 2017-05-22 ENCOUNTER — Encounter: Payer: Self-pay | Admitting: Emergency Medicine

## 2017-05-22 ENCOUNTER — Ambulatory Visit (INDEPENDENT_AMBULATORY_CARE_PROVIDER_SITE_OTHER): Payer: Medicare HMO

## 2017-05-22 ENCOUNTER — Ambulatory Visit
Admission: EM | Admit: 2017-05-22 | Discharge: 2017-05-22 | Disposition: A | Discharge status: Home / Self Care | Payer: Medicare HMO | Attending: Family Medicine | Admitting: Family Medicine

## 2017-05-22 DIAGNOSIS — S63501A Unspecified sprain of right wrist, initial encounter: Secondary | ICD-10-CM | POA: Diagnosis not present

## 2017-05-22 DIAGNOSIS — M25531 Pain in right wrist: Secondary | ICD-10-CM

## 2017-05-22 DIAGNOSIS — S60211A Contusion of right wrist, initial encounter: Secondary | ICD-10-CM | POA: Diagnosis not present

## 2017-05-22 DIAGNOSIS — M19031 Primary osteoarthritis, right wrist: Secondary | ICD-10-CM | POA: Diagnosis not present

## 2017-05-22 MED ORDER — HYDROCODONE-ACETAMINOPHEN 5-325 MG PO TABS: 1.0000 | ORAL_TABLET | Freq: Four times a day (QID) | ORAL | 0 refills | Status: DC | PRN

## 2017-05-22 NOTE — ED Triage Notes (Signed)
Patient in today c/o right wrist pain due to a fall on Friday (05/19/17). Patient states her wrist didn't swell until yesterday (05/21/17) and she took Advil for the swelling.

## 2017-05-22 NOTE — ED Provider Notes (Signed)
[facility]    CSN: [id number] Arrival date & time: 05/22/17  1431     History   Chief Complaint Chief Complaint  Patient presents with  . Wrist Pain    fall (05/19/17)    HPI Meghan Rivers is a 66 y.o. female.   66 yo female with a c/o right wrist pain and swelling for 3 days since falling at home. Has been icing and taking advil but still swollen and painful.    The history is provided by the patient.  Wrist Pain     Past Medical History:  Diagnosis Date  . Anxiety   . Depression   . Elevated liver enzymes   . Emphysema lung (HCC) 10/07/2016  . Essential hypertension   . Excessive drinking of alcohol   . Hyperlipidemia   . Hyponatremia   . Hypothyroidism   . Macrocytosis   . Mild Mitral and Tricuspid Regurgitation    a. 08/2014 Echo: EF 60-65%, mild MR/TR.  . Mild reactive airways disease   . Orthostasis    a. 08/2014->diuretics held.  . Osteoporosis   . Personal history of tobacco use, presenting hazards to health 07/10/2015  . Tobacco use   . Vulvar intraepithelial neoplasia II     Patient Active Problem List   Diagnosis Date Noted  . Hypoalbuminemia 01/17/2017  . Hyperbilirubinemia 01/17/2017  . Diarrhea 01/13/2017  . Elevated SGOT (AST) 01/13/2017  . Emphysema lung (HCC) 10/07/2016  . Right shoulder injury 08/03/2016  . Medication monitoring encounter 06/22/2016  . Osteopenia determined by x-ray 02/06/2016  . Aortic atherosclerosis (HCC) 07/21/2015  . Carotid stenosis 07/21/2015  . Lesion of liver 07/16/2015  . Atherosclerosis of coronary artery 07/16/2015  . Breast cancer screening 07/08/2015  . Preventative health care 07/08/2015  . Colon cancer screening 07/08/2015  . Anxiety   . Depression   . Hypothyroidism   . Mitral annular calcification   . Mild reactive airways disease   . Tobacco use   . Macrocytosis   . Excessive drinking of alcohol   . VIN III (vulvar intraepithelial neoplasia III)   . SOB (shortness of breath)  09/17/2014  . Irregular heart beats 09/17/2014  . Orthostasis 09/17/2014  . Hyperlipidemia 09/17/2014  . Essential hypertension 09/17/2014  . 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 ABDOMINAL HYSTERECTOMY     complete, dermoid tumor left ovary  . APPENDECTOMY    . AUGMENTATION MAMMAPLASTY Bilateral 1990  . BREAST ENHANCEMENT SURGERY    . SEPTOPLASTY      OB History    Gravida Para Term Preterm AB Living   SAB TAB Ectopic Multiple Live Births           2       Home Medications    Prior to Admission medications   Medication Sig Start Date End Date Taking? Authorizing Provider  atorvastatin (LIPITOR) 20 MG tablet Take 0.5 tablets (10 mg total) by mouth daily. 01/13/17  Yes Lada, Janit Bern, MD  busPIRone (BUSPAR) 10 MG tablet Take 1 tablet (10 mg total) by mouth 2 (two) times daily as needed. Patient taking differently: Take 1 tablet (10 mg total) by mouth prn 07/27/15  Yes Lada, Janit Bern, MD  ENSURE (ENSURE) Take 237 mLs by mouth daily.   Yes [provider]  levothyroxine (SYNTHROID, LEVOTHROID) 75 MCG tablet Take 1 tablet (  75 mcg total) by mouth daily before breakfast. 04/07/17  Yes Lada, Janit Bern, MD  metoprolol succinate (TOPROL-XL) 50 MG 24 hr tablet Take 1 tablet (50 mg total) by mouth daily. Take with or immediately f ollowing a meal. 01/09/17  Yes Gollan, Tollie Pizza, MD  mirtazapine (REMERON) 15 MG tablet Take 0.5 tablets (7.5 mg total) by mouth at bedtime. 05/08/17  Yes Lada, Janit Bern, MD  albuterol (PROVENTIL HFA;VENTOLIN HFA) 108 (90 BASE) MCG/ACT inhaler Inhale into the lungs every 6 (six) hours as needed for wheezing or shortness of breath.    [provider]  cyanocobalamin 500 MCG tablet Take 500 mcg by mouth 2 (two) times daily.    [provider]  folic acid (FOLVITE) 400 MCG tablet Take 400 mcg by mouth 2 (two) times daily.    [provider]  HYDROcodone-acetaminophen (NORCO/VICODIN) 5-325 MG tablet Take 1 tablet by mouth every 6 (six) hours as needed. 05/22/17   Payton Mccallum, MD  potassium chloride SA (K-DUR,KLOR-CON) 20 MEQ tablet Take 1 tablet (20 mEq total) by mouth 2 (two) times daily. Patient not taking: Reported on 01/31/2017 01/13/17   Kerman Passey, MD  Probiotic Product (ALOE 13244 & PROBIOTICS PO) Take by mouth.    [provider]    Family History Family History  Problem Relation Age of Onset  . Heart attack Father 40  . Hyperlipidemia Father   . Hypertension Father   . Heart disease Father   . Heart failure Father   . Stroke Father   . Kidney disease Father   . Emphysema Father   . COPD Father   . Heart disease Brother   . Heart attack Brother 50  . CAD Brother   . Cancer Mother        breast  . Breast cancer Mother 48  . Diverticulitis Maternal Grandmother   . Diabetes Neg Hx     Social History Social History  Substance Use Topics  . Smoking status: Current Every Day Smoker    Packs/day: 0.50    Years: 50.00    Types: Cigarettes  . Smokeless tobacco: Never Used     Comment: 7-8 cigs per day   . Alcohol use 6.0 oz/week    3 Cans of beer, 7 Shots of liquor per week     Comment: 2-3 Beers a day, 2 mixed drinks a day     Allergies   Benazepril and Erythromycin   Review of Systems Review of Systems   Physical Exam Triage Vital Signs ED Triage Vitals  Enc Vitals Group     BP 05/22/17 1451 (!) 125/106     Pulse Rate 05/22/17 1451 (!) 126     Resp 05/22/17 1451 16     Temp 05/22/17 1451 98.1 F (36.7 C)     Temp Source 05/22/17 1451 Oral     SpO2 05/22/17 1451 95 %     Weight 05/22/17 1450 118 lb (53.5 kg)     Height 05/22/17 1450  (1.702 m)     Head Circumference --      Peak Flow --      Pain Score 05/22/17 1450 7     Pain Loc --      Pain Edu? --      Excl. in GC? --    No data found.   Updated Vital Signs BP (!) 125/106 (BP Location: Left Arm)    Pulse (!) 126   Temp 98.1 F (36.7  C) (Oral)   Resp 16   Ht  (1.702 m)   Wt 118 lb (53.5 kg)   SpO2 95%   BMI 18.48 kg/m   Visual Acuity Right Eye Distance:   Left Eye Distance:   Bilateral Distance:    Right Eye Near:   Left Eye Near:    Bilateral Near:     Physical Exam  Constitutional: She appears well-developed and well-nourished. No distress.  Musculoskeletal:       Right wrist: She exhibits tenderness, bony tenderness and swelling. She exhibits normal range of motion, no effusion, no crepitus, no deformity and no laceration.  Skin: She is not diaphoretic.  Nursing note and vitals reviewed.    UC Treatments / Results  Labs (all labs ordered are listed, but only abnormal results are displayed) Labs Reviewed - No data to display  EKG  EKG Interpretation None       Radiology Dg Wrist Complete Right  Result Date: 05/22/2017 CLINICAL DATA:  Less post fall from bed 3-4 nights ago during the power average. The patient complains of pain along the radial aspect of the wrist. EXAM: RIGHT WRIST - COMPLETE 3+ VIEW COMPARISON:  None in PACs FINDINGS: The bones are subjectively adequately mineralized. There is moderate degenerative change with articular surface eburnation and joint space loss along the radial aspect of the carpal bones. This involves the articulation of the distal pole of the scaphoid with the trapezium and trapezoid as well as the first and second carpometacarpal joints. No acute fracture is observed. There is calcification of the triangular fibrocartilage along the ulnar aspect of the wrist. 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 No acute bony abnormality. Electronically Signed   By: David  [place] M.D.   On: 05/22/2017 15:25    Procedures Procedures (including critical care time)  Medications Ordered in  UC Medications - No data to display   Initial Impression / Assessment and Plan / UC Course  I have reviewed the triage vital signs and the nursing notes.  Pertinent labs & imaging results that were available during my care of the patient were reviewed by me and considered in my medical decision making (see chart for details).       Final Clinical Impressions(s) / UC Diagnoses   Final diagnoses:  Sprain of right wrist, initial encounter  Contusion of right wrist, initial encounter    New Prescriptions Discharge Medication List as of 05/22/2017  3:48 PM    START taking these medications   Details  HYDROcodone-acetaminophen (NORCO/VICODIN) 5-325 MG tablet Take 1 tablet by mouth every 6 (six) hours as needed., Starting Mon 05/22/2017, Print       1. x-ray results (negative) and diagnosis reviewed with patient 2. rx as per orders above; reviewed possible side effects, interactions, risks and benefits  3. Recommend supportive treatment with ice, rest, otc analgesics prn 4. Follow-up prn if symptoms worsen or don't improve  Controlled Substance Prescriptions [place] Controlled Substance Registry consulted? No   Payton Mccallum, MD 05/22/17 [phone]

## 2017-08-30 ENCOUNTER — Telehealth: Payer: Self-pay | Admitting: *Deleted

## 2017-08-30 DIAGNOSIS — Z87891 Personal history of nicotine dependence: Secondary | ICD-10-CM

## 2017-08-30 DIAGNOSIS — Z122 Encounter for screening for malignant neoplasm of respiratory organs: Secondary | ICD-10-CM

## 2017-08-30 NOTE — Telephone Encounter (Signed)
Notified patient that annual lung cancer screening low dose CT scan is due currently or will be in near future. Confirmed that patient is within the age range of 55-77, and asymptomatic, (no signs or symptoms of lung cancer). Patient denies illness that would prevent curative treatment for lung cancer if found. Verified smoking history, (current, 40.75 pack year). The shared decision making visit was done 07/10/15. Patient is agreeable for CT scan being scheduled.

## 2017-09-11 ENCOUNTER — Ambulatory Visit
Admission: RE | Admit: 2017-09-11 | Discharge: 2017-09-11 | Disposition: A | Discharge status: Home / Self Care | Payer: Medicare HMO | Source: Ambulatory Visit | Attending: Nurse Practitioner | Admitting: Nurse Practitioner

## 2017-09-11 DIAGNOSIS — R69 Illness, unspecified: Secondary | ICD-10-CM | POA: Diagnosis not present

## 2017-09-11 DIAGNOSIS — I2510 Atherosclerotic heart disease of native coronary artery without angina pectoris: Secondary | ICD-10-CM | POA: Insufficient documentation

## 2017-09-11 DIAGNOSIS — J439 Emphysema, unspecified: Secondary | ICD-10-CM | POA: Insufficient documentation

## 2017-09-11 DIAGNOSIS — Z122 Encounter for screening for malignant neoplasm of respiratory organs: Secondary | ICD-10-CM

## 2017-09-11 DIAGNOSIS — I700 Atherosclerosis of aorta: Secondary | ICD-10-CM | POA: Diagnosis not present

## 2017-09-11 DIAGNOSIS — Z87891 Personal history of nicotine dependence: Secondary | ICD-10-CM | POA: Diagnosis not present

## 2017-09-16 DIAGNOSIS — R69 Illness, unspecified: Secondary | ICD-10-CM | POA: Diagnosis not present

## 2017-09-16 DIAGNOSIS — J309 Allergic rhinitis, unspecified: Secondary | ICD-10-CM | POA: Diagnosis not present

## 2017-09-16 DIAGNOSIS — E46 Unspecified protein-calorie malnutrition: Secondary | ICD-10-CM | POA: Diagnosis not present

## 2017-09-16 DIAGNOSIS — I10 Essential (primary) hypertension: Secondary | ICD-10-CM | POA: Diagnosis not present

## 2017-09-16 DIAGNOSIS — R64 Cachexia: Secondary | ICD-10-CM | POA: Diagnosis not present

## 2017-09-16 DIAGNOSIS — Z681 Body mass index (BMI) 19 or less, adult: Secondary | ICD-10-CM | POA: Diagnosis not present

## 2017-09-16 DIAGNOSIS — Z7982 Long term (current) use of aspirin: Secondary | ICD-10-CM | POA: Diagnosis not present

## 2017-09-16 DIAGNOSIS — Z9181 History of falling: Secondary | ICD-10-CM | POA: Diagnosis not present

## 2017-09-16 DIAGNOSIS — E039 Hypothyroidism, unspecified: Secondary | ICD-10-CM | POA: Diagnosis not present

## 2017-09-18 ENCOUNTER — Encounter: Payer: Self-pay | Admitting: *Deleted

## 2017-09-26 NOTE — Progress Notes (Signed)
Closing out lab/order note open since:  Aug 2018 

## 2017-11-21 ENCOUNTER — Ambulatory Visit (INDEPENDENT_AMBULATORY_CARE_PROVIDER_SITE_OTHER): Payer: Medicare HMO

## 2017-11-21 VITALS — BP 102/60 | HR 83 | Temp 97.7°F | Resp 12 | Ht 67.0 in | Wt 122.4 lb

## 2017-11-21 DIAGNOSIS — Z9181 History of falling: Secondary | ICD-10-CM

## 2017-11-21 DIAGNOSIS — Z0000 Encounter for general adult medical examination without abnormal findings: Secondary | ICD-10-CM | POA: Diagnosis not present

## 2017-11-21 DIAGNOSIS — Z1231 Encounter for screening mammogram for malignant neoplasm of breast: Secondary | ICD-10-CM

## 2017-11-21 DIAGNOSIS — Z1239 Encounter for other screening for malignant neoplasm of breast: Secondary | ICD-10-CM

## 2017-11-21 NOTE — Progress Notes (Signed)
Subjective:   Meghan Rivers is a 67 y.o. female who presents for Medicare Annual (Subsequent) preventive examination.  Review of Systems:  N/A Cardiac Risk Factors include: advanced age (>63men, >40 women);dyslipidemia;sedentary lifestyle;smoking/ tobacco exposure;hypertension     Objective:     Vitals: BP 102/60 (BP Location: Left Arm, Patient Position: Sitting, Cuff Size: Normal)   Pulse 83   Temp 97.7 F (36.5 C) (Oral)   Resp 12   Ht 5\' 7"  (1.702 m)   Wt 122 lb 6.4 oz (55.5 kg)   SpO2 97%   BMI 19.17 kg/m   Body mass index is 19.17 kg/m.  Advanced Directives 11/21/2017 01/13/2017 10/07/2016 08/03/2016 06/22/2016 11/24/2015 11/16/2015  Does Patient Have a Medical Advance Directive? Yes Yes No No No No No  Type of Estate agent of [place];Living will - - - - - -  Copy of Healthcare Power of Attorney in Chart? No - copy requested - - - - - -  Would patient like information on creating a medical advance directive? - - - - No - patient declined information No - patient declined information No - patient declined information    Tobacco Social History   Tobacco Use  Smoking Status Current Every Day Smoker  . Packs/day: 0.25  . Years: 50.00  . Pack years: 12.50  . Types: Cigarettes  Smokeless Tobacco Never Used  Tobacco Comment   7-8 cigs per day      Ready to quit: No Counseling given: Yes Comment: 7-8 cigs per day    Clinical Intake:  Pre-visit preparation completed: Yes  Pain : No/denies pain   BMI - recorded: 19.17 Nutritional Status: BMI of 19-24  Normal Nutritional Risks: None Diabetes: No  How often do you need to have someone help you when you read instructions, pamphlets, or other written materials from your doctor or pharmacy?: 1 - Never  Interpreter Needed?: No  Information entered by :: AEversole, LPN  Hospitalizations/ED visits and surgeries occurring within the previous 12 months:  Pt was seen on 05/22/17 @ [facility] for R wrist sprain, treated by Dr. Pamala Hurry.  Within the previous 12 months, pt has not underwent any surgical procedures.  Past Medical History:  Diagnosis Date  . Anxiety   . Depression   . Elevated liver enzymes   . Emphysema lung (HCC) 10/07/2016  . Essential hypertension   . Excessive drinking of alcohol   . Hyperlipidemia   . Hyponatremia   . Hypothyroidism   . Macrocytosis   . Mild Mitral and Tricuspid Regurgitation    a. 08/2014 Echo: EF 60-65%, mild MR/TR.  . Mild reactive airways disease   . Orthostasis    a. 08/2014->diuretics held.  . Osteoporosis   . Personal history of tobacco use, presenting hazards to health 07/10/2015  . Tobacco use   . Vulvar intraepithelial neoplasia II    Past Surgical History:  Procedure Laterality Date  . ABDOMINAL HYSTERECTOMY     complete, dermoid tumor left ovary  . APPENDECTOMY    . AUGMENTATION MAMMAPLASTY Bilateral 1990  . BREAST ENHANCEMENT SURGERY    . SEPTOPLASTY     Family History  Problem Relation Age of Onset  . Heart attack Father 33  . Hyperlipidemia Father   . Hypertension Father   . Heart disease Father   . Heart failure Father   . Stroke Father   . Kidney disease Father   . Emphysema Father   . COPD Father   .  Heart disease Brother   . Heart attack Brother [id number]  . CAD Brother   . Cancer Mother        breast  . Breast cancer Mother [id number]  . Diverticulitis Maternal Grandmother   . Diabetes Neg Hx    Social History   Socioeconomic History  . Marital status: Widowed    Spouse name: Celene KrasClaude  . Number of children: 2  . Years of education: some college  . Highest education level: 12th grade  Occupational History  . Occupation: Retired  Engineer, productionocial Needs  . Financial resource strain: Not hard at all  . Food insecurity:    Worry: Never true    Inability: Never true  . Transportation needs:    Medical: No    Non-medical: No  Tobacco Use  . Smoking status: Current Every Day Smoker    Packs/day: 0.25    Years:  50.00    Pack years: 12.50    Types: Cigarettes  . Smokeless tobacco: Never Used  . Tobacco comment: 7-8 cigs per day   Substance and Sexual Activity  . Alcohol use: Yes    Comment: 2 [place] ice teas  . Drug use: No    Comment: History of marijuana use  . Sexual activity: Not Currently    Birth control/protection: None  Lifestyle  . Physical activity:    Days per week: 0 days    Minutes per session: 0 min  . Stress: Not at all  Relationships  . Social connections:    Talks on phone: Patient refused    Gets together: Patient refused    Attends religious service: Patient refused    Active member of club or organization: Patient refused    Attends meetings of clubs or organizations: Patient refused    Relationship status: Patient refused  Other Topics Concern  . Not on file  Social History Narrative  . Not on file    Outpatient Encounter Medications as of 11/21/2017  Medication Sig  . albuterol (PROVENTIL HFA;VENTOLIN HFA) 108 (90 BASE) MCG/ACT inhaler Inhale into the lungs every 6 (six) hours as needed for wheezing or shortness of breath.  Marland Kitchen. atorvastatin (LIPITOR) 20 MG tablet Take 0.5 tablets (10 mg total) by mouth daily.  . busPIRone (BUSPAR) 10 MG tablet Take 1 tablet (10 mg total) by mouth 2 (two) times daily as needed. (Patient taking differently: Take 1 tablet (10 mg total) by mouth prn)  . cyanocobalamin 500 MCG tablet Take 500 mcg by mouth 2 (two) times daily.  Marland Kitchen. ENSURE (ENSURE) Take 237 mLs by mouth daily.  . folic acid (FOLVITE) 400 MCG tablet Take 400 mcg by mouth 2 (two) times daily.  Marland Kitchen. levothyroxine (SYNTHROID, LEVOTHROID) 75 MCG tablet Take 1 tablet (75 mcg total) by mouth daily before breakfast.  . metoprolol succinate (TOPROL-XL) 50 MG 24 hr tablet Take 1 tablet (50 mg total) by mouth daily. Take with or immediately f ollowing a meal.  . mirtazapine (REMERON) 15 MG tablet Take 0.5 tablets (7.5 mg total) by mouth at bedtime.  . potassium chloride SA  (K-DUR,KLOR-CON) 20 MEQ tablet Take 1 tablet (20 mEq total) by mouth 2 (two) times daily.  . [DISCONTINUED] HYDROcodone-acetaminophen (NORCO/VICODIN) 5-325 MG tablet Take 1 tablet by mouth every 6 (six) hours as needed.  . [DISCONTINUED] Probiotic Product (ALOE 8119110000 & PROBIOTICS PO) Take by mouth.   No facility-administered encounter medications on file as of 11/21/2017.     Activities of Daily Living In your present state of  health, do you have any difficulty performing the following activities: 11/21/2017 01/13/2017  Hearing? N N  Comment denies hearing aids -  Vision? N Y  Comment wears eyeglasses -  Difficulty concentrating or making decisions? N N  Walking or climbing stairs? Y N  Comment fatigue -  Dressing or bathing? N N  Doing errands, shopping? Y N  Comment dtr transports -  Quarry manager and eating ? N -  Comment denies dentures -  Using the Toilet? Y -  Comment dtr assists at times -  In the past six months, have you accidently leaked urine? N -  Do you have problems with loss of bowel control? N -  Managing your Medications? N -  Managing your Finances? N -  Housekeeping or managing your Housekeeping? Y -  Comment dtr assists -  Some recent data might be hidden    Patient Care Team: Lada, Janit Bern, MD as PCP - General (Family Medicine) Antonieta Iba, MD as Consulting Physician (Cardiology)    Assessment:   This is a routine wellness examination for Arrielle.  Exercise Activities and Dietary recommendations Current Exercise Habits: The patient does not participate in regular exercise at present, Exercise limited by: Other - see comments(fatigue and weakness)  Goals    . DIET - INCREASE WATER INTAKE     Recommend to drink at least 6-8 8oz glasses of water per day.       Fall Risk Fall Risk  11/21/2017 01/13/2017 10/07/2016 06/22/2016 11/16/2015  Falls in the past year? Yes No No No No  Comment states she was standing up then blacked out - - - -  Number falls  in past yr: 2 or more - - - -  Injury with Fall? No - - - -  Risk Factor Category  High Fall Risk - - - -  Risk for fall due to : Impaired balance/gait;Impaired vision;Impaired mobility;History of fall(s) - - - -  Risk for fall due to: Comment fatigue; wears eyeglasses; unsteady on her feet; requires 1 person assist with transfers - - - -  Follow up Falls evaluation completed;Education provided;Falls prevention discussed - - - -   Is the home free of loose throw rugs in walkways, pet beds, electrical cords, etc? Yes Adequate lighting to reduce risk of falls?  Yes In addition, does the patient have any of the following: Stairs in or around the home WITH handrails? No Grab bars in the bathroom? Yes  Shower chair or a place to sit while bathing? Yes Use of an elevated toilet seat or a handicapped toilet? Yes Use of a cane, walker or w/c? Yes, uses all assistive devices to get around  Timed Get Up and Go Performed: Yes. Pt ambulated 10 feet within 45 sec. Gait slow, unsteady and with one person assist. Would benefit from PT for strengthening and ambulation. Fall risk prevention has been discussed.  Community Resource Referral not required at this time for installation of grab bars n the shower, shower chair or elevated toilet seat. However, Community Resource Referral was sent to Care Guide for PT - strengthening and ambulation Depression Screen PHQ 2/9 Scores 01/13/2017 10/07/2016 06/22/2016 11/16/2015  PHQ - 2 Score 0 0 0 0     Cognitive Function     6CIT Screen 11/21/2017  What Year? 0 points  What month? 0 points  What time? 0 points  Count back from 20 0 points  Months in reverse 0 points  Repeat phrase 10  points  Total Score 10    Immunization History  Administered Date(s) Administered  . Influenza, High Dose Seasonal PF 06/22/2016  . Td 08/13/2007    Qualifies for Shingles Vaccine? Yes. Due for Zostavax or Shingrix vaccine. Education has been provided regarding the importance  of this vaccine. Pt has been advised to call her insurance company to determine her out of pocket expense. Advised she may also receive this vaccine at her local pharmacy or Health Dept. Verbalized acceptance and understanding.  Overdue for Flu vaccine. Last administered 06/22/16. Education has been provided regarding the importance of this vaccine.  Due for Pneumoccocal vaccine. Declined my offer to administer today. Education has been provided regarding the importance of this vaccine but still declined. Pt has been advised to call our office is she should change her mind and wish to receive this vaccine. Also advised she may receive this vaccine at her local pharmacy or Health Dept. Pt is aware to provide a copy of her vaccination record if she chooses to receive this vaccine at his/her local pharmacy. Verbalized acceptance and understanding.  Due for Tdap vaccine. Education has been provided regarding the importance of this vaccine. Pt has been advised she may receive this vaccine at her local pharmacy or Health Dept. Also advised to provide a copy of her vaccination record if she chooses to receive this vaccine at her local pharmacy. Verbalized acceptance and understanding.  Screening Tests Health Maintenance  Topic Date Due  . MAMMOGRAM  11/15/2016  . TETANUS/TDAP  11/22/2018 (Originally 08/12/2017)  . PNA vac Low Risk Adult (1 of 2 - PCV13) 11/22/2018 (Originally 05/22/2014)  . DEXA SCAN  03/07/2018  . INFLUENZA VACCINE  03/08/2018  . Fecal DNA (Cologuard)  09/23/2018  . Hepatitis C Screening  Discontinued    Cancer Screenings: Lung Cancer Screening: Followed by Glenna Fellows, RN annually. Last CT 08/30/17 Breast:  Up to date on Mammogram? No. Completed 11/16/15. Dr. Valentino Saxon ordered 12/20/16 but pt states she did not complete the exam. No further explanation provided when asked to elaborate. Ordered today. Education has been provided regarding the importance of this screening. Provided with  contact information and advised to schedule appt. Verbalized acceptance and understanding.   Up to date of Bone Density/Dexa? Yes. Completed 03/07/16. Repeat every 2 years. Colorectal: Completed Cologuard 09/24/15. Repeat every 3 years  Additional Screenings: Hepatitis C Screening: Declined    Plan:  I have personally reviewed and addressed the Medicare Annual Wellness questionnaire and have noted the following in the patient's chart:  A. Medical and social history B. Use of alcohol, tobacco or illicit drugs  C. Current medications and supplements D. Functional ability and status E.  Nutritional status F.  Physical activity G. Advance directives H. List of other physicians I.  Hospitalizations, surgeries, and ER visits in previous 12 months J.  Vitals K. Screenings such as hearing and vision if needed, cognitive and depression L. Referrals and appointments  In addition, I have reviewed and discussed with patient certain preventive protocols, quality metrics, and best practice recommendations. A written personalized care plan for preventive services as well as general preventive health recommendations were provided to patient.  See attached scanned questionnaire for additional information.   Signed,  Deon Pilling, LPN Nurse Health Advisor

## 2017-11-21 NOTE — Patient Instructions (Signed)
Meghan Rivers , Thank you for taking time to come for your Medicare Wellness Visit. I appreciate your ongoing commitment to your health goals. Please review the following plan we discussed and let me know if I can assist you in the future.   Screening recommendations/referrals: Colorectal Screening: Completed Cologuard 09/24/15. Repeat every 3 years Mammogram: Completed 11/16/15. Repeat every year. Ordered today. Bone Density: Completed 03/07/16. Repeat every 2 years. Lung Cancer Screening: Please keep your appointment as scheduled with Glenna Fellows, RN Hepatitis C Screening: Declined  Vision and Dental Exams: Recommended annual ophthalmology exams for early detection of glaucoma and other disorders of the eye Recommended annual dental exams for proper oral hygiene  Vaccinations: Influenza vaccine: Overdue Pneumococcal vaccine: Declined Tdap vaccine: Declined. Please call your insurance company to determine your out of pocket expense. You may also receive this vaccine at your local pharmacy or Health Dept. Shingles vaccine: Please call your insurance company to determine your out of pocket expense for the Shingrix vaccine. You may also receive this vaccine at your local pharmacy or Health Dept.    Advanced directives: Advance directive discussed with you today. I have provided a copy for you to complete at home and have notarized. Once this is complete please bring a copy in to our office so we can scan it into your chart.  Conditions/risks identified: Recommend to drink at least 6-8 8oz glasses of water per day.  Next appointment: Please schedule your Annual Wellness Visit with your Nurse Health Advisor in one year.  Preventive Care 16 Years and Older, Female Preventive care refers to lifestyle choices and visits with your health care provider that can promote health and wellness. What does preventive care include?  A yearly physical exam. This is also called an annual well check.  Dental  exams once or twice a year.  Routine eye exams. Ask your health care provider how often you should have your eyes checked.  Personal lifestyle choices, including:  Daily care of your teeth and gums.  Regular physical activity.  Eating a healthy diet.  Avoiding tobacco and drug use.  Limiting alcohol use.  Practicing safe sex.  Taking low-dose aspirin every day.  Taking vitamin and mineral supplements as recommended by your health care provider. What happens during an annual well check? The services and screenings done by your health care provider during your annual well check will depend on your age, overall health, lifestyle risk factors, and family history of disease. Counseling  Your health care provider may ask you questions about your:  Alcohol use.  Tobacco use.  Drug use.  Emotional well-being.  Home and relationship well-being.  Sexual activity.  Eating habits.  History of falls.  Memory and ability to understand (cognition).  Work and work Astronomer.  Reproductive health. Screening  You may have the following tests or measurements:  Height, weight, and BMI.  Blood pressure.  Lipid and cholesterol levels. These may be checked every 5 years, or more frequently if you are over 92 years old.  Skin check.  Lung cancer screening. You may have this screening every year starting at age 1 if you have a 30-pack-year history of smoking and currently smoke or have quit within the past 15 years.  Fecal occult blood test (FOBT) of the stool. You may have this test every year starting at age 74.  Flexible sigmoidoscopy or colonoscopy. You may have a sigmoidoscopy every 5 years or a colonoscopy every 10 years starting at age 16.  Hepatitis  C blood test.  Hepatitis B blood test.  Sexually transmitted disease (STD) testing.  Diabetes screening. This is done by checking your blood sugar (glucose) after you have not eaten for a while (fasting). You may  have this done every 1-3 years.  Bone density scan. This is done to screen for osteoporosis. You may have this done starting at age 38.  Mammogram. This may be done every 1-2 years. Talk to your health care provider about how often you should have regular mammograms. Talk with your health care provider about your test results, treatment options, and if necessary, the need for more tests. Vaccines  Your health care provider may recommend certain vaccines, such as:  Influenza vaccine. This is recommended every year.  Tetanus, diphtheria, and acellular pertussis (Tdap, Td) vaccine. You may need a Td booster every 10 years.  Zoster vaccine. You may need this after age 73.  Pneumococcal 13-valent conjugate (PCV13) vaccine. One dose is recommended after age 61.  Pneumococcal polysaccharide (PPSV23) vaccine. One dose is recommended after age 52. Talk to your health care provider about which screenings and vaccines you need and how often you need them. This information is not intended to replace advice given to you by your health care provider. Make sure you discuss any questions you have with your health care provider. Document Released: 08/21/2015 Document Revised: 04/13/2016 Document Reviewed: 05/26/2015 Elsevier Interactive Patient Education  2017 [employer].  Fall Prevention in the Home Falls can cause injuries. They can happen to people of all ages. There are many things you can do to make your home safe and to help prevent falls. What can I do on the outside of my home?  Regularly fix the edges of walkways and driveways and fix any cracks.  Remove anything that might make you trip as you walk through a door, such as a raised step or threshold.  Trim any bushes or trees on the path to your home.  Use bright outdoor lighting.  Clear any walking paths of anything that might make someone trip, such as rocks or tools.  Regularly check to see if handrails are loose or broken. Make  sure that both sides of any steps have handrails.  Any raised decks and porches should have guardrails on the edges.  Have any leaves, snow, or ice cleared regularly.  Use sand or salt on walking paths during winter.  Clean up any spills in your garage right away. This includes oil or grease spills. What can I do in the bathroom?  Use night lights.  Install grab bars by the toilet and in the tub and shower. Do not use towel bars as grab bars.  Use non-skid mats or decals in the tub or shower.  If you need to sit down in the shower, use a plastic, non-slip stool.  Keep the floor dry. Clean up any water that spills on the floor as soon as it happens.  Remove soap buildup in the tub or shower regularly.  Attach bath mats securely with double-sided non-slip rug tape.  Do not have throw rugs and other things on the floor that can make you trip. What can I do in the bedroom?  Use night lights.  Make sure that you have a light by your bed that is easy to reach.  Do not use any sheets or blankets that are too big for your bed. They should not hang down onto the floor.  Have a firm chair that has side arms.  You can use this for support while you get dressed.  Do not have throw rugs and other things on the floor that can make you trip. What can I do in the kitchen?  Clean up any spills right away.  Avoid walking on wet floors.  Keep items that you use a lot in easy-to-reach places.  If you need to reach something above you, use a strong step stool that has a grab bar.  Keep electrical cords out of the way.  Do not use floor polish or wax that makes floors slippery. If you must use wax, use non-skid floor wax.  Do not have throw rugs and other things on the floor that can make you trip. What can I do with my stairs?  Do not leave any items on the stairs.  Make sure that there are handrails on both sides of the stairs and use them. Fix handrails that are broken or loose.  Make sure that handrails are as long as the stairways.  Check any carpeting to make sure that it is firmly attached to the stairs. Fix any carpet that is loose or worn.  Avoid having throw rugs at the top or bottom of the stairs. If you do have throw rugs, attach them to the floor with carpet tape.  Make sure that you have a light switch at the top of the stairs and the bottom of the stairs. If you do not have them, ask someone to add them for you. What else can I do to help prevent falls?  Wear shoes that:  Do not have high heels.  Have rubber bottoms.  Are comfortable and fit you well.  Are closed at the toe. Do not wear sandals.  If you use a stepladder:  Make sure that it is fully opened. Do not climb a closed stepladder.  Make sure that both sides of the stepladder are locked into place.  Ask someone to hold it for you, if possible.  Clearly mark and make sure that you can see:  Any grab bars or handrails.  First and last steps.  Where the edge of each step is.  Use tools that help you move around (mobility aids) if they are needed. These include:  Canes.  Walkers.  Scooters.  Crutches.  Turn on the lights when you go into a dark area. Replace any light bulbs as soon as they burn out.  Set up your furniture so you have a clear path. Avoid moving your furniture around.  If any of your floors are uneven, fix them.  If there are any pets around you, be aware of where they are.  Review your medicines with your doctor. Some medicines can make you feel dizzy. This can increase your chance of falling. Ask your doctor what other things that you can do to help prevent falls. This information is not intended to replace advice given to you by your health care provider. Make sure you discuss any questions you have with your health care provider. Document Released: 05/21/2009 Document Revised: 12/31/2015 Document Reviewed: 08/29/2014 Elsevier Interactive Patient  Education  2017 [employer]

## 2017-11-30 ENCOUNTER — Encounter: Payer: Self-pay | Admitting: Family Medicine

## 2017-11-30 ENCOUNTER — Ambulatory Visit (INDEPENDENT_AMBULATORY_CARE_PROVIDER_SITE_OTHER): Payer: Medicare HMO | Admitting: Family Medicine

## 2017-11-30 VITALS — BP 98/62 | HR 103 | Temp 97.7°F | Ht 67.0 in

## 2017-11-30 DIAGNOSIS — R413 Other amnesia: Secondary | ICD-10-CM

## 2017-11-30 DIAGNOSIS — R160 Hepatomegaly, not elsewhere classified: Secondary | ICD-10-CM

## 2017-11-30 DIAGNOSIS — F1010 Alcohol abuse, uncomplicated: Secondary | ICD-10-CM | POA: Diagnosis not present

## 2017-11-30 DIAGNOSIS — E038 Other specified hypothyroidism: Secondary | ICD-10-CM | POA: Diagnosis not present

## 2017-11-30 DIAGNOSIS — R059 Cough, unspecified: Secondary | ICD-10-CM

## 2017-11-30 DIAGNOSIS — R634 Abnormal weight loss: Secondary | ICD-10-CM

## 2017-11-30 DIAGNOSIS — R21 Rash and other nonspecific skin eruption: Secondary | ICD-10-CM | POA: Diagnosis not present

## 2017-11-30 DIAGNOSIS — E440 Moderate protein-calorie malnutrition: Secondary | ICD-10-CM

## 2017-11-30 DIAGNOSIS — J439 Emphysema, unspecified: Secondary | ICD-10-CM

## 2017-11-30 DIAGNOSIS — R69 Illness, unspecified: Secondary | ICD-10-CM | POA: Diagnosis not present

## 2017-11-30 DIAGNOSIS — R630 Anorexia: Secondary | ICD-10-CM

## 2017-11-30 DIAGNOSIS — I700 Atherosclerosis of aorta: Secondary | ICD-10-CM

## 2017-11-30 DIAGNOSIS — R05 Cough: Secondary | ICD-10-CM

## 2017-11-30 LAB — LIPID PANEL
CHOL/HDL RATIO: 2.5 (calc) (ref <5.0)
Cholesterol: 128 mg/dL (ref <200)
HDL: 51 mg/dL (ref >50)
LDL Cholesterol (Calc): 59 mg/dL (calc)
Non-HDL Cholesterol (Calc): 77 mg/dL (calc) (ref <130)
TRIGLYCERIDES: 94 mg/dL (ref <150)

## 2017-11-30 LAB — COMPLETE METABOLIC PANEL WITH GFR
AG Ratio: 1 (calc) (ref 1.0–2.5)
ALT: 12 U/L (ref 6–29)
AST: 21 U/L (ref 10–35)
Albumin: 2.6 g/dL — ABNORMAL LOW (ref 3.6–5.1)
Alkaline phosphatase (APISO): 195 U/L — ABNORMAL HIGH (ref 33–130)
BUN: 14 mg/dL (ref 7–25)
CALCIUM: 8.1 mg/dL — AB (ref 8.6–10.4)
CO2: 38 mmol/L — AB (ref 20–32)
CREATININE: 0.51 mg/dL (ref 0.50–0.99)
Chloride: <80 mmol/L — ABNORMAL LOW (ref 98–110)
GFR, EST NON AFRICAN AMERICAN: 99 mL/min/{1.73_m2} (ref >=60)
GFR, Est African American: 115 mL/min/{1.73_m2} (ref >=60)
GLOBULIN: 2.6 g/dL (ref 1.9–3.7)
Glucose, Bld: 104 mg/dL (ref 65–139)
Potassium: 2.2 mmol/L — CL (ref 3.5–5.3)
SODIUM: 128 mmol/L — AB (ref 135–146)
Total Bilirubin: 1.6 mg/dL — ABNORMAL HIGH (ref 0.2–1.2)
Total Protein: 5.2 g/dL — ABNORMAL LOW (ref 6.1–8.1)

## 2017-11-30 MED ORDER — MIRTAZAPINE 15 MG PO TABS: 7.5000 mg | ORAL_TABLET | Freq: Every day | ORAL | 5 refills | Status: AC

## 2017-11-30 MED ORDER — MIRTAZAPINE 15 MG PO TABS: 15.0000 mg | ORAL_TABLET | Freq: Every day | ORAL | 5 refills | Status: DC

## 2017-11-30 NOTE — Assessment & Plan Note (Signed)
Check TSH and free T4; not taking thyroid medicine regularly, just 3 x a week

## 2017-11-30 NOTE — Assessment & Plan Note (Addendum)
I advised her of safe limits; she has already had [place] Iced Tea this morning before appointment; she is not willing to go to AA right now; she is invited to call if she changes her mind; I am happy to refer to substance abuse counseling, but she is not willing to go to substance abuse counselor; I advised her that I think that her alcohol is killing her; we talked about how she is getting her alcohol; patient does not sound like she wants help at this time

## 2017-11-30 NOTE — Patient Instructions (Addendum)
Please call [phone] to schedule your imaging test Please wait 2-3 days after the order has been placed to call and get your test scheduled Let's get labs today If you have not heard anything from my staff in a week about any orders/referrals/studies from today, please contact us here to follow-up [phone] [employer]   Alcohol Use Disorder Alcohol use disorder is when your drinking disrupts your daily life. When you have this condition, you drink too much alcohol and you cannot control your drinking. Alcohol use disorder can cause serious problems with your physical health. It can affect your brain, heart, liver, pancreas, immune system, stomach, and intestines. Alcohol use disorder can increase your risk for certain cancers and cause problems with your mental health, such as depression, anxiety, psychosis, delirium, and dementia. People with this disorder risk hurting themselves and others. What are the causes? This condition is caused by drinking too much alcohol over time. It is not caused by drinking too much alcohol only one or two times. Some people with this condition drink alcohol to cope with or escape from negative life events. Others drink to relieve pain or symptoms of mental illness. What increases the risk? You are more likely to develop this condition if:  You have a family history of alcohol use disorder.  Your culture encourages drinking to the point of intoxication, or makes alcohol easy to get.  You had a mood or conduct disorder in childhood.  You have been a victim of abuse.  You are an adolescent and: ? You have poor grades or difficulties in school. ? Your caregivers do not talk to you about saying no to alcohol, or supervise your activities. ? You are impulsive or you have trouble with self-control.  What are the signs or symptoms? Symptoms of this condition include:  Drinkingmore than you want to.  Drinking for longer than you want  to.  Trying several times to drink less or to control your drinking.  Spending a lot of time getting alcohol, drinking, or recovering from drinking.  Craving alcohol.  Having problems at work, at school, or at home due to drinking.  Having problems in relationships due to drinking.  Drinking when it is dangerous to drink, such as before driving a car.  Continuing to drink even though you know you might have a physical or mental problem related to drinking.  Needing more and more alcohol to get the same effect you want from the alcohol (building up tolerance).  Having symptoms of withdrawal when you stop drinking. Symptoms of withdrawal include: ? Fatigue. ? Nightmares. ? Trouble sleeping. ? Depression. ? Anxiety. ? Fever. ? Seizures. ? Severe confusion. ? Feeling or seeing things that are not there (hallucinations). ? Tremors. ? Rapid heart rate. ? Rapid breathing. ? High blood pressure.  Drinking to avoid symptoms of withdrawal.  How is this diagnosed? This condition is diagnosed with an assessment. Your health care provider may start the assessment by asking three or four questions about your drinking. Your health care provider may perform a physical exam or do lab tests to see if you have physical problems resulting from alcohol use. She or he may refer you to a mental health professional for evaluation. How is this treated? Some people with alcohol use disorder are able to reduce their alcohol use to low-risk levels. Others need to completely quit drinking alcohol. When necessary, mental health professionals with specialized training in substance use treatment can help. Your health  care provider can help you decide how severe your alcohol use disorder is and what type of treatment you need. The following forms of treatment are available:  Detoxification. Detoxification involves quitting drinking and using prescription medicines within the first week to help lessen  withdrawal symptoms. This treatment is important for people who have had withdrawal symptoms before and for heavy drinkers who are likely to have withdrawal symptoms. Alcohol withdrawal can be dangerous, and in severe cases, it can cause death. Detoxification may be provided in a home, community, or primary care setting, or in a hospital or substance use treatment facility.  Counseling. This treatment is also called talk therapy. It is provided by substance use treatment counselors. A counselor can address the reasons you use alcohol and suggest ways to keep you from drinking again or to prevent problem drinking. The goals of talk therapy are to: ? Find healthy activities and ways for you to cope with stress. ? Identify and avoid the things that trigger your alcohol use. ? Help you learn how to handle cravings.  Medicines.Medicines can help treat alcohol use disorder by: ? Decreasing alcohol cravings. ? Decreasing the positive feeling you have when you drink alcohol. ? Causing an uncomfortable physical reaction when you drink alcohol (aversion therapy).  Support groups. Support groups are led by people who have quit drinking. They provide emotional support, advice, and guidance.  These forms of treatment are often combined. Some people with this condition benefit from a combination of treatments provided by specialized substance use treatment centers. Follow these instructions at home:  Take over-the-counter and prescription medicines only as told by your health care provider.  Check with your health care provider before starting any new medicines.  Ask friends and family members not to offer you alcohol.  Avoid situations where alcohol is served, including gatherings where others are drinking alcohol.  Create a plan for what to do when you are tempted to use alcohol.  Find hobbies or activities that you enjoy that do not include alcohol.  Keep all follow-up visits as told by your  health care provider. This is important. How is this prevented?  If you drink, limit alcohol intake to no more than 1 drink a day for nonpregnant women and 2 drinks a day for men. One drink equals 12 oz of beer, 5 oz of wine, or 1 oz of hard liquor.  If you have a mental health condition, get treatment and support.  Do not give alcohol to adolescents.  If you are an adolescent: ? Do not drink alcohol. ? Do not be afraid to say no if someone offers you alcohol. Speak up about why you do not want to drink. You can be a positive role model for your friends and set a good example for those around you by not drinking alcohol. ? If your friends drink, spend time with others who do not drink alcohol. Make new friends who do not use alcohol. ? Find healthy ways to manage stress and emotions, such as meditation or deep breathing, exercise, spending time in nature, listening to music, or talking with a trusted friend or family member. Contact a health care provider if:  You are not able to take your medicines as told.  Your symptoms get worse.  You return to drinking alcohol (relapse) and your symptoms get worse. Get help right away if:  You have thoughts about hurting yourself or others. If you ever feel like you may hurt yourself or others, or  have thoughts about taking your own life, get help right away. You can go to your nearest emergency department or call:  Your local emergency services (911 in the [place]  A suicide crisis helpline, such as the National Suicide Prevention Lifeline at 775-828-1985. This is open 24 hours a day.  Summary  Alcohol use disorder is when your drinking disrupts your daily life. When you have this condition, you drink too much alcohol and you cannot control your drinking.  Treatment may include detoxification, counseling, medicine, and support groups.  Ask friends and family members not to offer you alcohol. Avoid situations where alcohol is served.  Get  help right away if you have thoughts about hurting yourself or others. This information is not intended to replace advice given to you by your health care provider. Make sure you discuss any questions you have with your health care provider. Document Released: 09/01/2004 Document Revised: 04/21/2016 Document Reviewed: 04/21/2016 Elsevier Interactive Patient Education  2018 [employer].  Alcohol Use Disorder Alcohol use disorder is when your drinking disrupts your daily life. When you have this condition, you drink too much alcohol and you cannot control your drinking. Alcohol use disorder can cause serious problems with your physical health. It can affect your brain, heart, liver, pancreas, immune system, stomach, and intestines. Alcohol use disorder can increase your risk for certain cancers and cause problems with your mental health, such as depression, anxiety, psychosis, delirium, and dementia. People with this disorder risk hurting themselves and others. What are the causes? This condition is caused by drinking too much alcohol over time. It is not caused by drinking too much alcohol only one or two times. Some people with this condition drink alcohol to cope with or escape from negative life events. Others drink to relieve pain or symptoms of mental illness. What increases the risk? You are more likely to develop this condition if:  You have a family history of alcohol use disorder.  Your culture encourages drinking to the point of intoxication, or makes alcohol easy to get.  You had a mood or conduct disorder in childhood.  You have been a victim of abuse.  You are an adolescent and: ? You have poor grades or difficulties in school. ? Your caregivers do not talk to you about saying no to alcohol, or supervise your activities. ? You are impulsive or you have trouble with self-control.  What are the signs or symptoms? Symptoms of this condition include:  Drinkingmore than you  want to.  Drinking for longer than you want to.  Trying several times to drink less or to control your drinking.  Spending a lot of time getting alcohol, drinking, or recovering from drinking.  Craving alcohol.  Having problems at work, at school, or at home due to drinking.  Having problems in relationships due to drinking.  Drinking when it is dangerous to drink, such as before driving a car.  Continuing to drink even though you know you might have a physical or mental problem related to drinking.  Needing more and more alcohol to get the same effect you want from the alcohol (building up tolerance).  Having symptoms of withdrawal when you stop drinking. Symptoms of withdrawal include: ? Fatigue. ? Nightmares. ? Trouble sleeping. ? Depression. ? Anxiety. ? Fever. ? Seizures. ? Severe confusion. ? Feeling or seeing things that are not there (hallucinations). ? Tremors. ? Rapid heart rate. ? Rapid breathing. ? High blood pressure.  Drinking to avoid symptoms of  withdrawal.  How is this diagnosed? This condition is diagnosed with an assessment. Your health care provider may start the assessment by asking three or four questions about your drinking. Your health care provider may perform a physical exam or do lab tests to see if you have physical problems resulting from alcohol use. She or he may refer you to a mental health professional for evaluation. How is this treated? Some people with alcohol use disorder are able to reduce their alcohol use to low-risk levels. Others need to completely quit drinking alcohol. When necessary, mental health professionals with specialized training in substance use treatment can help. Your health care provider can help you decide how severe your alcohol use disorder is and what type of treatment you need. The following forms of treatment are available:  Detoxification. Detoxification involves quitting drinking and using prescription  medicines within the first week to help lessen withdrawal symptoms. This treatment is important for people who have had withdrawal symptoms before and for heavy drinkers who are likely to have withdrawal symptoms. Alcohol withdrawal can be dangerous, and in severe cases, it can cause death. Detoxification may be provided in a home, community, or primary care setting, or in a hospital or substance use treatment facility.  Counseling. This treatment is also called talk therapy. It is provided by substance use treatment counselors. A counselor can address the reasons you use alcohol and suggest ways to keep you from drinking again or to prevent problem drinking. The goals of talk therapy are to: ? Find healthy activities and ways for you to cope with stress. ? Identify and avoid the things that trigger your alcohol use. ? Help you learn how to handle cravings.  Medicines.Medicines can help treat alcohol use disorder by: ? Decreasing alcohol cravings. ? Decreasing the positive feeling you have when you drink alcohol. ? Causing an uncomfortable physical reaction when you drink alcohol (aversion therapy).  Support groups. Support groups are led by people who have quit drinking. They provide emotional support, advice, and guidance.  These forms of treatment are often combined. Some people with this condition benefit from a combination of treatments provided by specialized substance use treatment centers. Follow these instructions at home:  Take over-the-counter and prescription medicines only as told by your health care provider.  Check with your health care provider before starting any new medicines.  Ask friends and family members not to offer you alcohol.  Avoid situations where alcohol is served, including gatherings where others are drinking alcohol.  Create a plan for what to do when you are tempted to use alcohol.  Find hobbies or activities that you enjoy that do not include  alcohol.  Keep all follow-up visits as told by your health care provider. This is important. How is this prevented?  If you drink, limit alcohol intake to no more than 1 drink a day for nonpregnant women and 2 drinks a day for men. One drink equals 12 oz of beer, 5 oz of wine, or 1 oz of hard liquor.  If you have a mental health condition, get treatment and support.  Do not give alcohol to adolescents.  If you are an adolescent: ? Do not drink alcohol. ? Do not be afraid to say no if someone offers you alcohol. Speak up about why you do not want to drink. You can be a positive role model for your friends and set a good example for those around you by not drinking alcohol. ? If your friends  drink, spend time with others who do not drink alcohol. Make new friends who do not use alcohol. ? Find healthy ways to manage stress and emotions, such as meditation or deep breathing, exercise, spending time in nature, listening to music, or talking with a trusted friend or family member. Contact a health care provider if:  You are not able to take your medicines as told.  Your symptoms get worse.  You return to drinking alcohol (relapse) and your symptoms get worse. Get help right away if:  You have thoughts about hurting yourself or others. If you ever feel like you may hurt yourself or others, or have thoughts about taking your own life, get help right away. You can go to your nearest emergency department or call:  Your local emergency services (911 in the [place]  A suicide crisis helpline, such as the National Suicide Prevention Lifeline at (909) 613-3487. This is open 24 hours a day.  Summary  Alcohol use disorder is when your drinking disrupts your daily life. When you have this condition, you drink too much alcohol and you cannot control your drinking.  Treatment may include detoxification, counseling, medicine, and support groups.  Ask friends and family members not to offer you  alcohol. Avoid situations where alcohol is served.  Get help right away if you have thoughts about hurting yourself or others. This information is not intended to replace advice given to you by your health care provider. Make sure you discuss any questions you have with your health care provider. Document Released: 09/01/2004 Document Revised: 04/21/2016 Document Reviewed: 04/21/2016 Elsevier Interactive Patient Education  2018 [employer]. Alcohol Abuse and Nutrition Alcohol abuse is any pattern of alcohol consumption that harms your health, relationships, or work. Alcohol abuse can affect how your body breaks down and absorbs nutrients from food by causing your liver to work abnormally. Additionally, many people who abuse alcohol do not eat enough carbohydrates, protein, fat, vitamins, and minerals. This can cause poor nutrition (malnutrition) and a lack of nutrients (nutrient deficiencies), which can lead to further complications. Nutrients that are commonly lacking (deficient) among people who abuse alcohol include:  Vitamins. ? Vitamin A. This is stored in your liver. It is important for your vision, metabolism, and ability to fight off infections (immunity). ? B vitamins. These include vitamins such as folate, thiamin, and niacin. These are important in new cell growth and maintenance. ? Vitamin C. This plays an important role in iron absorption, wound healing, and immunity. ? Vitamin D. This is produced by your liver, but you can also get vitamin D from food. Vitamin D is necessary for your body to absorb and use calcium.  Minerals. ? Calcium. This is important for your bones and your heart and blood vessel (cardiovascular) function. ? Iron. This is important for blood, muscle, and nervous system functioning. ? Magnesium. This plays an important role in muscle and nerve function, and it helps to control blood sugar and blood pressure. ? Zinc. This is important for the normal function of  your nervous system and digestive system (gastrointestinal tract).  Nutrition is an essential component of therapy for alcohol abuse. Your health care provider or dietitian will work with you to design a plan that can help restore nutrients to your body and prevent potential complications. What is my plan? Your dietitian may develop a specific diet plan that is based on your condition and any other complications you may have. A diet plan will commonly include:  A balanced diet. ?  Grains: 6-8 oz per day. ? Vegetables: 2-3 cups per day. ? Fruits: 1-2 cups per day. ? Meat and other protein: 5-6 oz per day. ? Dairy: 2-3 cups per day.  Vitamin and mineral supplements.  What do I need to know about alcohol and nutrition?  Consume foods that are high in antioxidants, such as grapes, berries, nuts, green tea, and dark green and orange vegetables. This can help to counteract some of the stress that is placed on your liver by consuming alcohol.  Avoid food and drinks that are high in fat and sugar. Foods such as sugared soft drinks, salty snack foods, and candy contain empty calories. This means that they lack important nutrients such as protein, fiber, and vitamins.  Eat frequent meals and snacks. Try to eat 5-6 small meals each day.  Eat a variety of fresh fruits and vegetables each day. This will help you get plenty of water, fiber, and vitamins in your diet.  Drink plenty of water and other clear fluids. Try to drink at least 48-64 oz (1.5-2 L) of water per day.  If you are a vegetarian, eat a variety of protein-rich foods. Pair whole grains with plant-based proteins at meals and snacks to obtain the greatest nutrient benefit from your food. For example, eat rice with beans, put peanut butter on whole-grain toast, or eat oatmeal with sunflower seeds.  Soak beans and whole grains overnight before cooking. This can help your body to absorb the nutrients more easily.  Include foods fortified  with vitamins and minerals in your diet. Commonly fortified foods include milk, orange juice, cereal, and bread.  If you are malnourished, your dietitian may recommend a high-protein, high-calorie diet. This may include: ? 2,000-3,000 calories (kilocalories) per day. ? 70-100 grams of protein per day.  Your health care provider may recommend a complete nutritional supplement beverage. This can help to restore calories, protein, and vitamins to your body. Depending on your condition, you may be advised to consume this instead of or in addition to meals.  Limit your intake of caffeine. Replace drinks like coffee and black tea with decaffeinated coffee and herbal tea.  Eat a variety of foods that are high in omega fatty acids. These include fish, nuts and seeds, and soybeans. These foods may help your liver to recover and may also stabilize your mood.  Certain medicines may cause changes in your appetite, taste, and weight. Work with your health care provider and dietitian to make any adjustments to your medicines and diet plan.  Include other healthy lifestyle choices in your daily routine. ? Be physically active. ? Get enough sleep. ? Spend time doing activities that you enjoy.  If you are unable to take in enough food and calories by mouth, your health care provider may recommend a feeding tube. This is a tube that passes through your nose and throat, directly into your stomach. Nutritional supplement beverages can be given to you through the feeding tube to help you get the nutrients you need.  Take vitamin or mineral supplements as recommended by your health care provider. What foods can I eat? Grains Enriched pasta. Enriched rice. Fortified whole-grain bread. Fortified whole-grain cereal. Barley. Brown rice. Quinoa. Millet. Vegetables All fresh, frozen, and canned vegetables. Spinach. Kale. Artichoke. Carrots. Winter squash and pumpkin. Sweet potatoes. Broccoli. Cabbage. Cucumbers.  Tomatoes. Sweet peppers. Green beans. Peas. Corn. Fruits All fresh and frozen fruits. Berries. Grapes. Mango. Papaya. Guava. Cherries. Apples. Bananas. Peaches. Plums. Pineapple. Watermelon. Cantaloupe. Oranges. Avocado.  Meats and Other Protein Sources Beef liver. Lean beef. Pork. Fresh and canned chicken. Fresh fish. Oysters. Sardines. Canned tuna. Shrimp. Eggs with yolks. Nuts and seeds. Peanut butter. Beans and lentils. Soybeans. Tofu. Dairy Whole, low-fat, and nonfat milk. Whole, low-fat, and nonfat yogurt. Cottage cheese. Sour cream. Hard and soft cheeses. Beverages Water. Herbal tea. Decaffeinated coffee. Decaffeinated green tea. 100% fruit juice. 100% vegetable juice. Instant breakfast shakes. Condiments Ketchup. Mayonnaise. Mustard. Salad dressing. Barbecue sauce. Sweets and Desserts Sugar-free ice cream. Sugar-free pudding. Sugar-free gelatin. Fats and Oils Butter. Vegetable oil, flaxseed oil, olive oil, and walnut oil. Other Complete nutrition shakes. Protein bars. Sugar-free gum. The items listed above may not be a complete list of recommended foods or beverages. Contact your dietitian for more options. What foods are not recommended? Grains Sugar-sweetened breakfast cereals. Flavored instant oatmeal. Fried breads. Vegetables Breaded or deep-fried vegetables. Fruits Dried fruit with added sugar. Candied fruit. Canned fruit in syrup. Meats and Other Protein Sources Breaded or deep-fried meats. Dairy Flavored milks. Fried cheese curds or fried cheese sticks. Beverages Alcohol. Sugar-sweetened soft drinks. Sugar-sweetened tea. Caffeinated coffee and tea. Condiments Sugar. Honey. Agave nectar. Molasses. Sweets and Desserts Chocolate. Cake. Cookies. Candy. Other Potato chips. Pretzels. Salted nuts. Candied nuts. The items listed above may not be a complete list of foods and beverages to avoid. Contact your dietitian for more information. This information is not intended to  replace advice given to you by your health care provider. Make sure you discuss any questions you have with your health care provider. Document Released: 05/19/2005 Document Revised: 12/02/2015 Document Reviewed: 02/25/2014 Elsevier Interactive Patient Education  [employer].

## 2017-11-30 NOTE — Progress Notes (Signed)
BP 98/62 (BP Location: Right Arm, Patient Position: Sitting, Cuff Size: Small)   Pulse (!) 103   Temp 97.7 F (36.5 C) (Oral)   Ht 5\' 7"  (1.702 m)   SpO2 98%   BMI 19.17 kg/m    Subjective:    Patient ID: Meghan Rivers, female    DOB: 1950-11-19, 67 y.o.   MRN: [id number]  HPI: Meghan Rivers is a 67 y.o. female  Chief Complaint  Patient presents with  . Follow-up  . Anorexia    1 yogurt per day     HPI Patient is here with her friend, Arline Asp Just not eating, not getting hungry Friend is here and trying to help her eat Some yogurt and cheese Gagged up once on half of a banana  At home on the scale with not many clothes two weeks ago, she was 102 pounds She is wobbly and needs help to stand; needs someone to hold her up She is not hungry Smoking 3 cigarettes a day Drinking 2 [place] Iced Teas a day; she drank even before coming in this morning, last alcohol around 10 am She fell two weeks ago; back hurt and head hurt; was having a little drink to help with the pain; the pain has left her; hurt over the right side ribs; no one checked her out after the fall; patient refused rescue;  She does remember the fall; does not recall if she was drinking; she thinks maybe mid-afternoon  Breathing is okay, not using SABA; just not ready to quit smoking Friend understands with her not being hungry and losing weight; she lost 40 pounds herself  Has not driven the car in two months; I asked how patient is getting her alcohol; Arline Asp shares that the family is aware that "this is the way she wants to go" so they are getting the alcohol for her and bringing it to her; they don't want her driving; they have permission with one another; knows to not drink and drive  Patient does not wish to have another colonoscopy  Emphysema and coronary artery disease; sees Dr. Mariah Milling for her heart, no chest pain; not taking aspirin and not taking beta-blocker  Depression screen [facility] 2/9 11/30/2017  01/13/2017 10/07/2016 06/22/2016 11/16/2015  Decreased Interest 0 0 0 0 0  Down, Depressed, Hopeless 0 0 0 0 0  PHQ - 2 Score 0 0 0 0 0    Relevant past medical, surgical, family and social history reviewed Past Medical History:  Diagnosis Date  . Anxiety   . Depression   . Elevated liver enzymes   . Emphysema lung (HCC) 10/07/2016  . Essential hypertension   . Excessive drinking of alcohol   . Hyperlipidemia   . Hyponatremia   . Hypothyroidism   . Macrocytosis   . Mild Mitral and Tricuspid Regurgitation    a. 08/2014 Echo: EF 60-65%, mild MR/TR.  . Mild reactive airways disease   . Orthostasis    a. 08/2014->diuretics held.  . Osteoporosis   . Personal history of tobacco use, presenting hazards to health 07/10/2015  . Tobacco use   . Vulvar intraepithelial neoplasia II    Past Surgical History:  Procedure Laterality Date  . ABDOMINAL HYSTERECTOMY     complete, dermoid tumor left ovary  . APPENDECTOMY    . AUGMENTATION MAMMAPLASTY Bilateral 1990  . BREAST ENHANCEMENT SURGERY    . SEPTOPLASTY     Family History  Problem Relation Age of Onset  . Heart  attack Father 6962  . Hyperlipidemia Father   . Hypertension Father   . Heart disease Father   . Heart failure Father   . Stroke Father   . Kidney disease Father   . Emphysema Father   . COPD Father   . Heart disease Brother   . Heart attack Brother [id number]  . CAD Brother   . Cancer Mother        breast  . Breast cancer Mother 5960  . Diverticulitis Maternal Grandmother   . Diabetes Neg Hx    Social History   Tobacco Use  . Smoking status: Current Every Day Smoker    Packs/day: 0.25    Years: 50.00    Pack years: 12.50    Types: Cigarettes  . Smokeless tobacco: Never Used  . Tobacco comment: 7-8 cigs per day   Substance Use Topics  . Alcohol use: Yes    Comment: 2 [place] ice teas  . Drug use: No    Comment: History of marijuana use   Interim medical history since last visit reviewed. Allergies and medications  reviewed  Review of Systems  Constitutional: Positive for unexpected weight change. Negative for fatigue.  HENT:       Not eating and drinking  Eyes: Negative for visual disturbance.  Respiratory: Negative for cough and wheezing.   Cardiovascular: Negative for chest pain.  Gastrointestinal: Negative for abdominal pain and blood in stool.  Endocrine: Positive for cold intolerance.  Genitourinary: Negative for hematuria.  Musculoskeletal: Negative for arthralgias and myalgias.  Skin: Positive for rash (rash on the arms and back for at least a month; not intense).  Neurological: Positive for speech difficulty (dry tongue) and weakness. Negative for tremors and headaches.  Psychiatric/Behavioral: Negative for dysphoric mood. The patient is not nervous/anxious.    Per HPI unless specifically indicated above     Objective:    BP 98/62 (BP Location: Right Arm, Patient Position: Sitting, Cuff Size: Small)   Pulse (!) 103   Temp 97.7 F (36.5 C) (Oral)   Ht 5\' 7"  (1.702 m)   SpO2 98%   BMI 19.17 kg/m   Wt Readings from Last 3 Encounters:  11/21/17 122 lb 6.4 oz (55.5 kg)  09/11/17 120 lb (54.4 kg)  05/22/17 118 lb (53.5 kg)    Physical Exam  Constitutional: No distress.  Thin elderly female; seated in wheelchair; significant decline in health since last seen; appears thin, malnourished  HENT:  Mouth/Throat: Mucous membranes are dry.  Eyes: EOM are normal. No scleral icterus.  Neck: No thyromegaly present.  Cardiovascular: Normal rate, regular rhythm and normal heart sounds.  No murmur heard. Pulmonary/Chest: Effort normal and breath sounds normal. No respiratory distress. She has no wheezes.  Abdominal: Soft. Bowel sounds are normal. She exhibits no distension. There is hepatomegaly. There is no tenderness.  Musculoskeletal: She exhibits no edema.  Neurological: She is alert. She displays no seizure activity.  Limited historian; delay in answering questions; speech not slurred,  but took more than would be expected time counting backwards, for example until she got to "3-2-1"; generalized weakness; no facial asymmetry  Skin: Lesion (picking at keratotic lesion LEFT radial wrist/hand) and rash (scattered dusky erythematous plaques with scale on the arms, trunk, oval and nummular) noted. She is not diaphoretic. No pallor.  Skin coloring is not jaundiced, but appears unhealthy grayish color, not pink   6CIT Screen 11/30/2017 11/21/2017  What Year? 0 points 0 points  What month? 0 points 0  points  What time? 0 points 0 points  Count back from 20 0 points 0 points  Months in reverse 4 points 0 points  Repeat phrase 4 points 10 points  Total Score 8 10      Assessment & Plan:   Problem List Items Addressed This Visit      Cardiovascular and Mediastinum   Aortic atherosclerosis (HCC) (Chronic)   Relevant Orders   Lipid panel (Completed)     Respiratory   Emphysema lung (HCC)     Endocrine   Hypothyroidism (Chronic)    Check TSH and free T4; not taking thyroid medicine regularly, just 3 x a week      Relevant Orders   TSH (Completed)     Other   Excessive drinking of alcohol (Chronic)    I advised her of safe limits; she has already had [place] Iced Tea this morning before appointment; she is not willing to go to AA right now; she is invited to call if she changes her mind; I am happy to refer to substance abuse counseling, but she is not willing to go to substance abuse counselor; I advised her that I think that her alcohol is killing her; we talked about how she is getting her alcohol; patient does not sound like she wants help at this time      Relevant Orders   B12 and Folate Panel (Completed)    Other Visit Diagnoses    Weight loss    -  Primary   patient not eating, drinking primarily EtOH, smoking; concern too for malignancy, hyperthyroidism, 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; labs   Relevant Orders   CBC with Differential/Platelet  (Completed)   COMPLETE METABOLIC PANEL WITH GFR (Completed)   HIV antibody (Completed)   Loss of appetite       Memory impairment       suspect alcohol, but would also want to get brain imaging to r/o atrophy   Rash of body       will check labs   Relevant Orders   HIV antibody (Completed)   ANA,IFA RA Diag Pnl w/rflx Tit/Patn   Cough       pt avers that this has been going since before she had the chest CT, no different from when she had her imaging done; reviewed report   Abnormal weight loss       Relevant Orders   CT Abdomen Pelvis W Contrast   Hepatomegaly       order imaging; suspicious for alcoholic hepatitis, liver cancer   Moderate protein-calorie malnutrition (HCC)       by clinical exam; will be checking labs; suggested Magic Cup, showed picture to friend to see if they can find that for her   Memory loss       Relevant Orders   CT Head Wo Contrast       Follow up plan: Return in about 2 weeks (around 12/14/2017) for follow-up visit with Dr. Sherie Don.  An after-visit summary was printed and given to the patient at check-out.  Please see the patient instructions which may contain other information and recommendations beyond what is mentioned above in the assessment and plan.  Meds ordered this encounter  Medications  . DISCONTD: mirtazapine (REMERON) 15 MG tablet    Sig: Take 1 tablet (15 mg total) by mouth at bedtime.    Dispense:  30 tablet    Refill:  5  . mirtazapine (REMERON) 15 MG tablet  Sig: Take 0.5 tablets (7.5 mg total) by mouth at bedtime.    Dispense:  15 tablet    Refill:  5    Ignore the other Rx   Orders Placed This Encounter  Procedures  . CT Abdomen Pelvis W Contrast  . CT Head Wo Contrast  . CBC with Differential/Platelet  . COMPLETE METABOLIC PANEL WITH GFR  . HIV antibody  . TSH  . B12 and Folate Panel  . Lipid panel  . ANA,IFA RA Diag Pnl w/rflx Tit/Patn  Face-to-face time with patient was more than 40 minutes, >50% time spent  counseling and coordination of care

## 2017-12-01 ENCOUNTER — Telehealth: Payer: Self-pay

## 2017-12-01 ENCOUNTER — Telehealth: Payer: Self-pay | Admitting: Family Medicine

## 2017-12-01 DIAGNOSIS — E876 Hypokalemia: Secondary | ICD-10-CM

## 2017-12-01 DIAGNOSIS — R748 Abnormal levels of other serum enzymes: Secondary | ICD-10-CM

## 2017-12-01 LAB — CBC WITH DIFFERENTIAL/PLATELET
BASOS ABS: 12 {cells}/uL (ref 0–200)
BASOS PCT: 0.2 %
EOS PCT: 0.3 %
Eosinophils Absolute: 18 cells/uL (ref 15–500)
HEMATOCRIT: 31.6 % — AB (ref 35.0–45.0)
HEMOGLOBIN: 11.3 g/dL — AB (ref 11.7–15.5)
LYMPHS ABS: 1581 {cells}/uL (ref 850–3900)
MCH: 36.9 pg — ABNORMAL HIGH (ref 27.0–33.0)
MCHC: 35.8 g/dL (ref 32.0–36.0)
MCV: 103.3 fL — ABNORMAL HIGH (ref 80.0–100.0)
MPV: 10.3 fL (ref 7.5–12.5)
Monocytes Relative: 7.1 %
NEUTROS ABS: 3870 {cells}/uL (ref 1500–7800)
Neutrophils Relative %: 65.6 %
Platelets: 319 10*3/uL (ref 140–400)
RBC: 3.06 10*6/uL — ABNORMAL LOW (ref 3.80–5.10)
RDW: 15.5 % — ABNORMAL HIGH (ref 11.0–15.0)
Total Lymphocyte: 26.8 %
WBC mixed population: 419 cells/uL (ref 200–950)
WBC: 5.9 10*3/uL (ref 3.8–10.8)

## 2017-12-01 LAB — HIV ANTIBODY (ROUTINE TESTING W REFLEX): HIV 1&2 Ab, 4th Generation: NONREACTIVE

## 2017-12-01 LAB — B12 AND FOLATE PANEL
FOLATE: 2.3 ng/mL — AB
VITAMIN B 12: 1118 pg/mL — AB (ref 200–1100)

## 2017-12-01 LAB — TSH: TSH: 3.41 m[IU]/L (ref 0.40–4.50)

## 2017-12-01 MED ORDER — FOLIC ACID 400 MCG PO TABS: 400.0000 ug | ORAL_TABLET | Freq: Two times a day (BID) | ORAL | 0 refills | Status: AC

## 2017-12-01 MED ORDER — POTASSIUM CHLORIDE CRYS ER 20 MEQ PO TBCR: 20.0000 meq | EXTENDED_RELEASE_TABLET | Freq: Three times a day (TID) | ORAL | 0 refills | Status: DC

## 2017-12-01 MED ORDER — MAGNESIUM OXIDE 250 MG PO TABS: ORAL_TABLET | ORAL | 0 refills | Status: AC

## 2017-12-01 NOTE — Telephone Encounter (Signed)
There have been several attempts to get patient to the emergency department; patient adamantly has declined If she absolutely will not, the next thing I can offer is to give prescriptions, but this by no means replaces care in the emergency department I still urge patient to get to the ER for treatment ASAP; not doing so can result in death; we just want her to be aware of this again Return for repeat labs on Monday; I will be out of the office, but please ask Dr. Carlynn PurlSowles to review

## 2017-12-01 NOTE — Telephone Encounter (Signed)
Called pt no answer. LM for pt strongly urging her to go get prescriptions that Dr. Sherie DonLada sent in for her, strongly advised pt to go to ED. CRM created.

## 2017-12-01 NOTE — Telephone Encounter (Signed)
Incoming call received from pt's emergency contact and close family friend Meghan PilgrimCarol Rivers. She states that she attempted to call pt multiples times this morning and was initially unable to get her on the phone, finally spoke with pt, informed her of the need to go to the ED. The patient is refusing to go to the ED, pt is also refusing to eat. Multiple family friends have attempted to convince the patient to go but she refuses.

## 2017-12-04 LAB — ANA,IFA RA DIAG PNL W/RFLX TIT/PATN
Anti Nuclear Antibody(ANA): NEGATIVE
Rhuematoid fact SerPl-aCnc: <14 IU/mL (ref <14)

## 2017-12-05 ENCOUNTER — Ambulatory Visit: Admission: RE | Admit: 2017-12-05 | Payer: Medicare HMO | Source: Ambulatory Visit

## 2017-12-07 ENCOUNTER — Telehealth: Payer: Self-pay | Admitting: Family Medicine

## 2017-12-07 NOTE — Telephone Encounter (Signed)
I contacted [facility] at the number listed below and spoke with the nurse clinical advisor. We went over some information and she asked me to fax over the results from this patient's lose dose CT lung cancer screening to [phone].

## 2017-12-07 NOTE — Telephone Encounter (Signed)
Copied from CRM [id number]. Topic: Quick Communication - See Telephone Encounter >> Dec 07, 2017 10:55 AM Eston Mould B wrote: CRM for notification. See Telephone encounter for: 12/07/17.  Joe   from [facility] called about the   PA for  CT Scan  of abd and pelvis, it  is not approved at this time , not denied but  in pending status,  can still submit clinical information  for review   can also  send  GGT  results   or other imaging related    Joe's contact number is [phone] option 4 ,  case  #[id number]

## 2017-12-14 ENCOUNTER — Telehealth: Payer: Self-pay | Admitting: Family Medicine

## 2017-12-14 DIAGNOSIS — R21 Rash and other nonspecific skin eruption: Secondary | ICD-10-CM

## 2017-12-14 DIAGNOSIS — E876 Hypokalemia: Secondary | ICD-10-CM

## 2017-12-14 DIAGNOSIS — F1010 Alcohol abuse, uncomplicated: Secondary | ICD-10-CM

## 2017-12-14 DIAGNOSIS — R413 Other amnesia: Secondary | ICD-10-CM

## 2017-12-14 DIAGNOSIS — R630 Anorexia: Secondary | ICD-10-CM

## 2017-12-14 DIAGNOSIS — E440 Moderate protein-calorie malnutrition: Secondary | ICD-10-CM

## 2017-12-14 DIAGNOSIS — R634 Abnormal weight loss: Secondary | ICD-10-CM

## 2017-12-14 NOTE — Telephone Encounter (Signed)
Copied from CRM [id number]. Topic: Quick Communication - See Telephone Encounter >> Dec 14, 2017 12:18 PM Marleen, Moret wrote: Pt friend cindy grailer is calling to cancel pt appt 12/15/17 at 120 with dr Olen Cordial due to having issues walking .  Ms. Erma Heritage is on pt hippa and is wanting to talk with someone regarding  Getting home health to come in   Best number [phone]

## 2017-12-15 ENCOUNTER — Ambulatory Visit: Payer: Medicare HMO | Admitting: Nurse Practitioner

## 2017-12-15 NOTE — Telephone Encounter (Signed)
Patient resting when called and Cindy answered phone.  She states patient is asking if Home Health could help her and is agreeable to home health intervention for PT and NSG.  Patient might also benefit from Palliative Care consult to discuss options and alternatives to treatment with patient so that she can make informed choices regarding treatment.  CG will follow up with Arline Asp next week to see how things are going.   She may benefit from [facility] consult as well if HH unsuccessful or if you don't think it would overwhelm patient.  Let me know if I can assist with any of referrals.  Patient has not had HH previously so she would just need HH that takes her insurance.

## 2017-12-15 NOTE — Telephone Encounter (Signed)
So I entered the home health referral Can someone please make sure they go out quickly? Thank you

## 2017-12-15 NOTE — Addendum Note (Signed)
Addended by: LADA, Janit Bern on: 12/15/2017 01:08 PM   Modules accepted: Orders

## 2017-12-15 NOTE — Telephone Encounter (Signed)
Twenty minute discussion last night with Meghan Rivers Patient refuses to go to the hopistal; family has called 911 three times, EMS has come out, she refuses to go They would like home health to come in, evaluate, help in any way they can

## 2017-12-15 NOTE — Telephone Encounter (Signed)
Please refer to order and Dr. Marlise Eves request. Please also provide her with an update re: status.

## 2017-12-15 NOTE — Telephone Encounter (Signed)
Received notification from Dr. Sherie Don re: pt decline and recent refusals to seek treatment/care from EMS and ED. Called [facility]3 Care Guide, Amy Baker. Informed her of Dr. Marlise Eves concerns and request. Advised Amy that this pt was also seen recently for AWV. C3 referral had been placed at that time for PT to eval for strengthening due to high risk for injury related fall. Amy states she will call the pt to ensure that she is agreeable to move forward with PT for strengthening as ordered, further discuss safety needs within home, and will also discuss the possibility of adding HH. Amy further states she will discuss another option of palliative care. Amy is aware that [facility] complex CM is a possible consideration for this pt as well. Will await Amy's response re: her conversation with the pt.

## 2017-12-17 NOTE — Telephone Encounter (Signed)
Contacted patient and caregiver who state they have not been contacted by home health as of 1 pm today.  CG called [facility] whom referral was given and on call staff called said referral was transferred to [facility].  [facility] called and verified they received order late on Friday and are waiting to determine copay status so they can make sure patient is okay with copay before she incurs charge.  They state they will call her in am and should have information verified at that time.  Will contact patient again tomorrow to make sure that [facility] is in place.

## 2017-12-18 ENCOUNTER — Telehealth: Payer: Self-pay | Admitting: Family Medicine

## 2017-12-18 DIAGNOSIS — R630 Anorexia: Secondary | ICD-10-CM | POA: Diagnosis not present

## 2017-12-18 DIAGNOSIS — E440 Moderate protein-calorie malnutrition: Secondary | ICD-10-CM | POA: Diagnosis not present

## 2017-12-18 DIAGNOSIS — R638 Other symptoms and signs concerning food and fluid intake: Secondary | ICD-10-CM | POA: Diagnosis not present

## 2017-12-18 DIAGNOSIS — I700 Atherosclerosis of aorta: Secondary | ICD-10-CM | POA: Diagnosis not present

## 2017-12-18 DIAGNOSIS — I2510 Atherosclerotic heart disease of native coronary artery without angina pectoris: Secondary | ICD-10-CM | POA: Diagnosis not present

## 2017-12-18 DIAGNOSIS — R634 Abnormal weight loss: Secondary | ICD-10-CM | POA: Diagnosis not present

## 2017-12-18 DIAGNOSIS — R69 Illness, unspecified: Secondary | ICD-10-CM | POA: Diagnosis not present

## 2017-12-18 DIAGNOSIS — I10 Essential (primary) hypertension: Secondary | ICD-10-CM | POA: Diagnosis not present

## 2017-12-18 DIAGNOSIS — J439 Emphysema, unspecified: Secondary | ICD-10-CM | POA: Diagnosis not present

## 2017-12-18 DIAGNOSIS — E786 Lipoprotein deficiency: Secondary | ICD-10-CM | POA: Diagnosis not present

## 2017-12-18 DIAGNOSIS — E876 Hypokalemia: Secondary | ICD-10-CM | POA: Diagnosis not present

## 2017-12-18 NOTE — Telephone Encounter (Signed)
Please draw: CBC CMP Magnesium Urinalysis with dip and micro and reflux culture

## 2017-12-18 NOTE — Telephone Encounter (Signed)
Is it magnesium and cmp?

## 2017-12-18 NOTE — Telephone Encounter (Signed)
Copied from CRM [id number]. Topic: Quick Communication - See Telephone Encounter >> Dec 18, 2017 10:06 AM Lorrine Kin, NT wrote: CRM for notification. See Telephone encounter for: 12/18/17. Crystal Johnson from [facility], calling and states that a referral was placed and there was a request for labs also. The nurse is going out today (12/18/17) and is needing to know which labs to draw. Please advise. CB#: [phone]

## 2017-12-18 NOTE — Telephone Encounter (Signed)
Nurse notified  

## 2017-12-20 ENCOUNTER — Telehealth: Payer: Self-pay | Admitting: Family Medicine

## 2017-12-20 ENCOUNTER — Other Ambulatory Visit: Payer: Self-pay | Admitting: Family Medicine

## 2017-12-20 DIAGNOSIS — R627 Adult failure to thrive: Secondary | ICD-10-CM

## 2017-12-20 DIAGNOSIS — E46 Unspecified protein-calorie malnutrition: Secondary | ICD-10-CM

## 2017-12-20 MED ORDER — POTASSIUM CHLORIDE 25 MEQ PO PACK: PACK | ORAL | 0 refills | Status: AC

## 2017-12-20 MED ORDER — POTASSIUM CHLORIDE 20 MEQ PO PACK: 20.0000 meq | PACK | Freq: Three times a day (TID) | ORAL | 0 refills | Status: DC

## 2017-12-20 MED ORDER — POTASSIUM CHLORIDE 20 MEQ/15ML (10%) PO SOLN: 20.0000 meq | Freq: Three times a day (TID) | ORAL | 0 refills | Status: DC

## 2017-12-20 NOTE — Telephone Encounter (Signed)
Try potassium packets, mix in some orange juice

## 2017-12-20 NOTE — Telephone Encounter (Signed)
Please refer to HOSPICE Severe malnutrition primary diagnosis Failure to thrive Severe electrolyte abnormalities, hypokalemia

## 2017-12-20 NOTE — Telephone Encounter (Signed)
Meghan Rivers called in said that the potassium that was called in is going to be $195.00.  She would like to know the other pill form of potassium can be called in and they are going to crush or dissolve  it so she can take it   Pharmacy - [facility]

## 2017-12-20 NOTE — Telephone Encounter (Signed)
Patient's labs are markedly abnormal I called to speak to patient about labs Tani is about the same Arline Asp says Home health came out [place] told her they don't need PT They are ready for Hospice Arline Asp says Patient wants to go with Hospice section; that's how she wants to go She has basically given up Arline Asp says that she is not wanting to go to the hospital Explained that her labs are very abnormal; potassium is critical again She cannot even go to the bathroom by herself, takes two people She does indeed sound to be actively dying She is falling off the sofa; has a blown up mattress to protect her if she falls She will not want CT scan or any treatment; does not want to find out what's going on; not sure if alcoholism, cirrhosis, cancer, etc; patient wants to be kept comfortable; son wanted to take her back to [place] but patient just wants to stay and be cared for by Hospice I respect her decision and desires Patient never took the potassium because she couldn't swallow it; will give PO liquid

## 2017-12-20 NOTE — Progress Notes (Signed)
They suggested potassium chloride effervescent tablets; will give 100 mEq over 2 days, then 100 mEq over 4 days, then just 25 mEq twice a week to maintain

## 2017-12-20 NOTE — Addendum Note (Signed)
Addended by: LADA, Janit Bern on: 12/20/2017 04:20 PM   Modules accepted: Orders

## 2017-12-21 NOTE — Telephone Encounter (Signed)
notified

## 2017-12-22 ENCOUNTER — Encounter: Payer: Self-pay | Admitting: Family Medicine

## 2017-12-22 ENCOUNTER — Telehealth: Payer: Self-pay | Admitting: Family Medicine

## 2017-12-22 NOTE — Telephone Encounter (Signed)
Copied from CRM [id number]. Topic: Quick Communication - See Telephone Encounter >> Dec 22, 2017  4:17 PM Raquel Sarna wrote: Alece Koppel - pt's son - [phone]  He is wanting a call back to discuss pt care.  He says Hospice was sent to the home.  Pt isn't needing Hospice care. Pt needs basic in home health care. Please call son back asap.

## 2017-12-22 NOTE — Telephone Encounter (Signed)
Spoke with Arline Asp and informed to pick up potassium RX from [facility] that was sent on 12/20/17.  Cindy verbalized understanding and stated that she would go pick it up for Kacie.

## 2017-12-22 NOTE — Telephone Encounter (Signed)
Son is not listed as someone I can talk to

## 2017-12-22 NOTE — Telephone Encounter (Signed)
Spoke with Arline Asp, who is on Ellin's DPR , and she informed that Lititia is needing a Home Health because she is in need of a hospital bed and aid to come help her with basic needs, such as bathing etc.  Arline Asp stated that a Home Health Nurse came out on Monday to draw blood and she informed Arline Asp that Jordyn would benefit from having Home Health come out.  A hospice representative came out on today for evaluation and said that Rosealyn is not at the point where she needs hospice care right now.  Arline Asp also stated that the liquid potassium is to expensive and would like get the original pills that you prescribed for Tawnya.  She stated that she has a pill crusher and that she can mix the crushed potassium pills into Keishla's yogurt.  The problem with pills before is that Austynn would not take them because she could not swallow them.  I informed Arline Asp to please let Ross's son know that we are unable to speak with due to him not being listed on her DPR.  Yailene verbalized understanding and stated that son is in the process being listed as POA for mother Zetha.

## 2017-12-22 NOTE — Telephone Encounter (Signed)
Please contact home health to see what other needs patient has that require orders (hospital bed, etc.) and we'll be glad to cover those

## 2017-12-22 NOTE — Telephone Encounter (Signed)
I sent Rx back to [facility] on 12/20/17 per their recommendation Should be there

## 2017-12-25 NOTE — Telephone Encounter (Signed)
Left detailed voicemail with home health

## 2017-12-26 ENCOUNTER — Encounter: Payer: Medicare HMO | Admitting: Obstetrics and Gynecology

## 2018-01-08 DIAGNOSIS — N3946 Mixed incontinence: Secondary | ICD-10-CM | POA: Diagnosis not present

## 2018-01-08 DIAGNOSIS — R69 Illness, unspecified: Secondary | ICD-10-CM | POA: Diagnosis not present

## 2018-01-08 DIAGNOSIS — E43 Unspecified severe protein-calorie malnutrition: Secondary | ICD-10-CM | POA: Diagnosis not present

## 2018-01-08 DIAGNOSIS — R634 Abnormal weight loss: Secondary | ICD-10-CM | POA: Diagnosis not present

## 2018-01-08 DIAGNOSIS — M6281 Muscle weakness (generalized): Secondary | ICD-10-CM | POA: Diagnosis not present

## 2018-01-08 DIAGNOSIS — E876 Hypokalemia: Secondary | ICD-10-CM | POA: Diagnosis not present

## 2018-01-08 DIAGNOSIS — R630 Anorexia: Secondary | ICD-10-CM | POA: Diagnosis not present

## 2018-01-08 DIAGNOSIS — Z7401 Bed confinement status: Secondary | ICD-10-CM | POA: Diagnosis not present

## 2018-01-10 DIAGNOSIS — R109 Unspecified abdominal pain: Secondary | ICD-10-CM | POA: Diagnosis not present

## 2018-01-12 DIAGNOSIS — G9341 Metabolic encephalopathy: Secondary | ICD-10-CM | POA: Diagnosis not present

## 2018-01-12 DIAGNOSIS — E512 Wernicke's encephalopathy: Secondary | ICD-10-CM | POA: Diagnosis not present

## 2018-01-12 DIAGNOSIS — B9620 Unspecified Escherichia coli [E. coli] as the cause of diseases classified elsewhere: Secondary | ICD-10-CM | POA: Diagnosis not present

## 2018-01-12 DIAGNOSIS — E871 Hypo-osmolality and hyponatremia: Secondary | ICD-10-CM | POA: Diagnosis not present

## 2018-01-12 DIAGNOSIS — R627 Adult failure to thrive: Secondary | ICD-10-CM | POA: Diagnosis not present

## 2018-01-12 DIAGNOSIS — R29818 Other symptoms and signs involving the nervous system: Secondary | ICD-10-CM | POA: Diagnosis not present

## 2018-01-12 DIAGNOSIS — J439 Emphysema, unspecified: Secondary | ICD-10-CM | POA: Diagnosis not present

## 2018-01-12 DIAGNOSIS — R54 Age-related physical debility: Secondary | ICD-10-CM | POA: Diagnosis not present

## 2018-01-12 DIAGNOSIS — R5381 Other malaise: Secondary | ICD-10-CM | POA: Diagnosis not present

## 2018-01-12 DIAGNOSIS — R1310 Dysphagia, unspecified: Secondary | ICD-10-CM | POA: Diagnosis not present

## 2018-01-12 DIAGNOSIS — R600 Localized edema: Secondary | ICD-10-CM | POA: Diagnosis not present

## 2018-01-12 DIAGNOSIS — R633 Feeding difficulties: Secondary | ICD-10-CM | POA: Diagnosis not present

## 2018-01-12 DIAGNOSIS — J90 Pleural effusion, not elsewhere classified: Secondary | ICD-10-CM | POA: Diagnosis not present

## 2018-01-12 DIAGNOSIS — R9082 White matter disease, unspecified: Secondary | ICD-10-CM | POA: Diagnosis not present

## 2018-01-12 DIAGNOSIS — N390 Urinary tract infection, site not specified: Secondary | ICD-10-CM | POA: Diagnosis not present

## 2018-01-12 DIAGNOSIS — E876 Hypokalemia: Secondary | ICD-10-CM | POA: Diagnosis not present

## 2018-01-12 DIAGNOSIS — D72829 Elevated white blood cell count, unspecified: Secondary | ICD-10-CM | POA: Diagnosis not present

## 2018-01-12 DIAGNOSIS — R0602 Shortness of breath: Secondary | ICD-10-CM | POA: Diagnosis not present

## 2018-01-12 DIAGNOSIS — R55 Syncope and collapse: Secondary | ICD-10-CM | POA: Diagnosis not present

## 2018-01-12 DIAGNOSIS — E860 Dehydration: Secondary | ICD-10-CM | POA: Diagnosis not present

## 2018-01-12 DIAGNOSIS — Z681 Body mass index (BMI) 19 or less, adult: Secondary | ICD-10-CM | POA: Diagnosis not present

## 2018-01-12 DIAGNOSIS — E875 Hyperkalemia: Secondary | ICD-10-CM | POA: Diagnosis not present

## 2018-01-12 DIAGNOSIS — Z7401 Bed confinement status: Secondary | ICD-10-CM | POA: Diagnosis not present

## 2018-01-12 DIAGNOSIS — R079 Chest pain, unspecified: Secondary | ICD-10-CM | POA: Diagnosis not present

## 2018-01-12 DIAGNOSIS — G319 Degenerative disease of nervous system, unspecified: Secondary | ICD-10-CM | POA: Diagnosis not present

## 2018-01-12 DIAGNOSIS — K7689 Other specified diseases of liver: Secondary | ICD-10-CM | POA: Diagnosis not present

## 2018-01-12 DIAGNOSIS — E46 Unspecified protein-calorie malnutrition: Secondary | ICD-10-CM | POA: Diagnosis not present

## 2018-01-12 DIAGNOSIS — G3184 Mild cognitive impairment, so stated: Secondary | ICD-10-CM | POA: Diagnosis not present

## 2018-01-12 DIAGNOSIS — E8770 Fluid overload, unspecified: Secondary | ICD-10-CM | POA: Diagnosis not present

## 2018-01-12 DIAGNOSIS — G9340 Encephalopathy, unspecified: Secondary | ICD-10-CM | POA: Diagnosis not present

## 2018-01-12 DIAGNOSIS — R404 Transient alteration of awareness: Secondary | ICD-10-CM | POA: Diagnosis not present

## 2018-01-12 DIAGNOSIS — R918 Other nonspecific abnormal finding of lung field: Secondary | ICD-10-CM | POA: Diagnosis not present

## 2018-01-12 DIAGNOSIS — J449 Chronic obstructive pulmonary disease, unspecified: Secondary | ICD-10-CM | POA: Diagnosis not present

## 2018-01-12 DIAGNOSIS — R34 Anuria and oliguria: Secondary | ICD-10-CM | POA: Diagnosis not present

## 2018-01-12 DIAGNOSIS — R410 Disorientation, unspecified: Secondary | ICD-10-CM | POA: Diagnosis not present

## 2018-01-12 DIAGNOSIS — Z720 Tobacco use: Secondary | ICD-10-CM | POA: Diagnosis not present

## 2018-01-12 DIAGNOSIS — R0689 Other abnormalities of breathing: Secondary | ICD-10-CM | POA: Diagnosis not present

## 2018-01-12 DIAGNOSIS — R634 Abnormal weight loss: Secondary | ICD-10-CM | POA: Diagnosis not present

## 2018-01-12 DIAGNOSIS — R51 Headache: Secondary | ICD-10-CM | POA: Diagnosis not present

## 2018-01-12 DIAGNOSIS — R4182 Altered mental status, unspecified: Secondary | ICD-10-CM | POA: Diagnosis not present

## 2018-01-12 DIAGNOSIS — R69 Illness, unspecified: Secondary | ICD-10-CM | POA: Diagnosis not present

## 2018-01-12 DIAGNOSIS — Z431 Encounter for attention to gastrostomy: Secondary | ICD-10-CM | POA: Diagnosis not present

## 2018-01-12 DIAGNOSIS — E43 Unspecified severe protein-calorie malnutrition: Secondary | ICD-10-CM | POA: Diagnosis not present

## 2018-06-03 IMAGING — US US ABDOMEN LIMITED
1 series · 13 of 25 positions shown · non-contrast
Comparison: MRI of the liver December 08, 2015 and limited right upper
quadrant ultrasound July 23, 2015.

CLINICAL DATA: Elevated liver function studies, elevated bilirubin,
decreased of view mid, excessive alcohol consumption. Known hepatic
cysts.

EXAM:
ULTRASOUND ABDOMEN LIMITED RIGHT UPPER QUADRANT

[Series 1: us abdomen limited · 0.19mm/px · 13 of 61 slices shown]
[im 1/61]
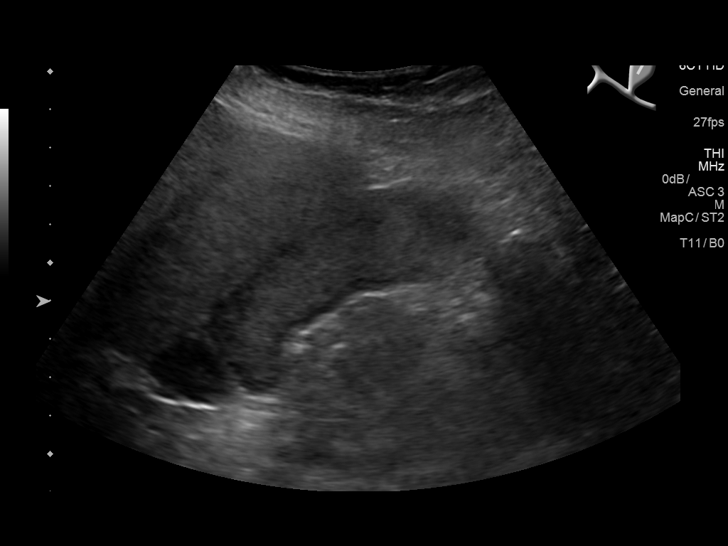
[im 6/61]
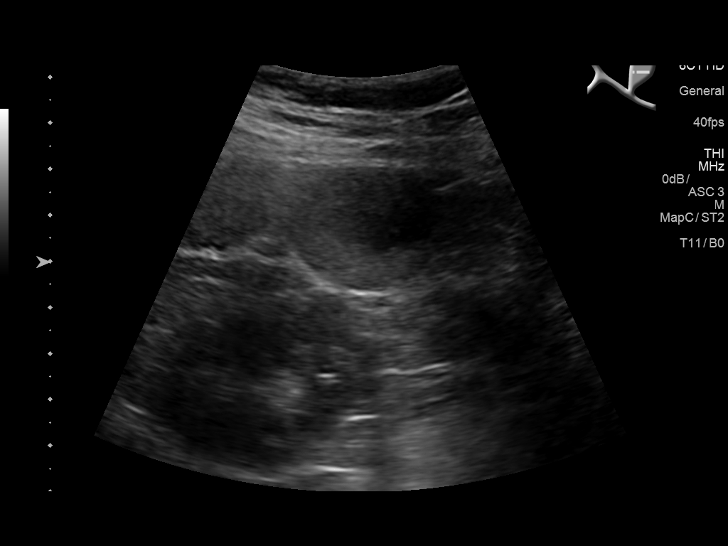
[im 11/61]
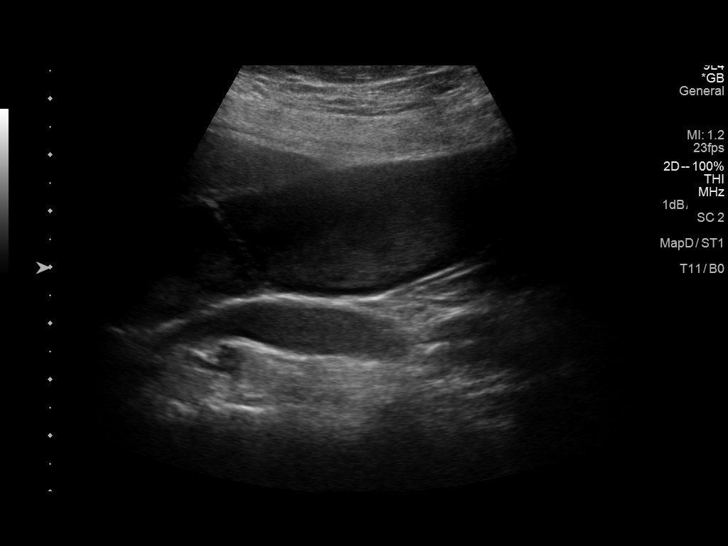
[im 16/61]
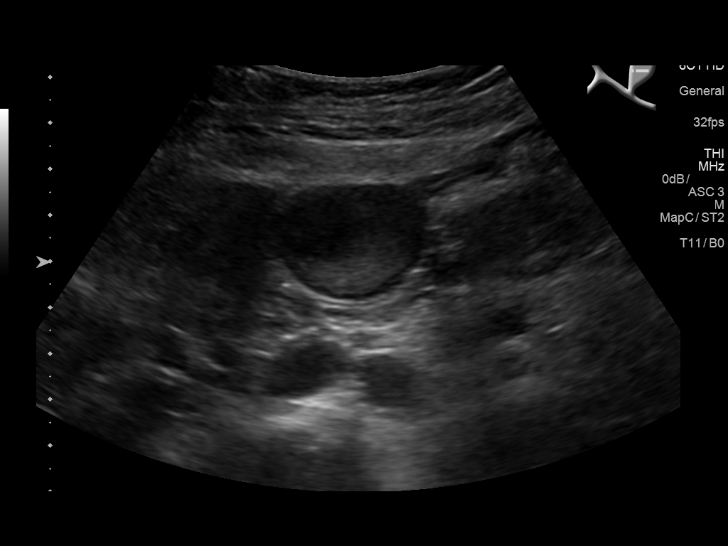
[im 21/61]
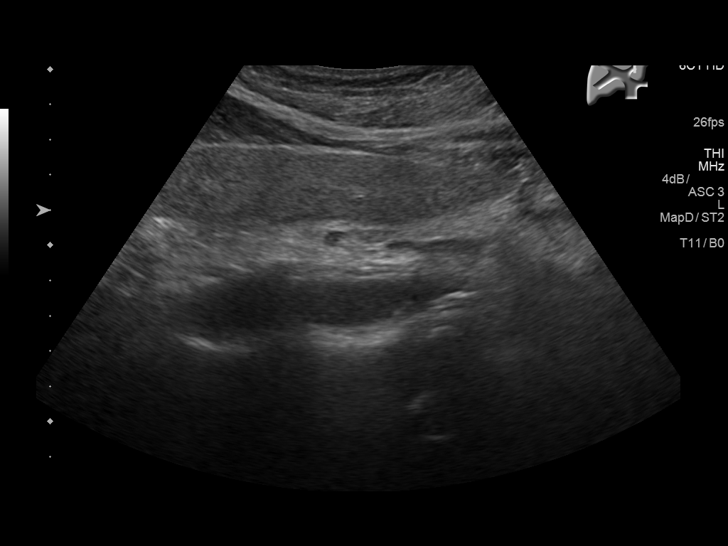
[im 26/61]
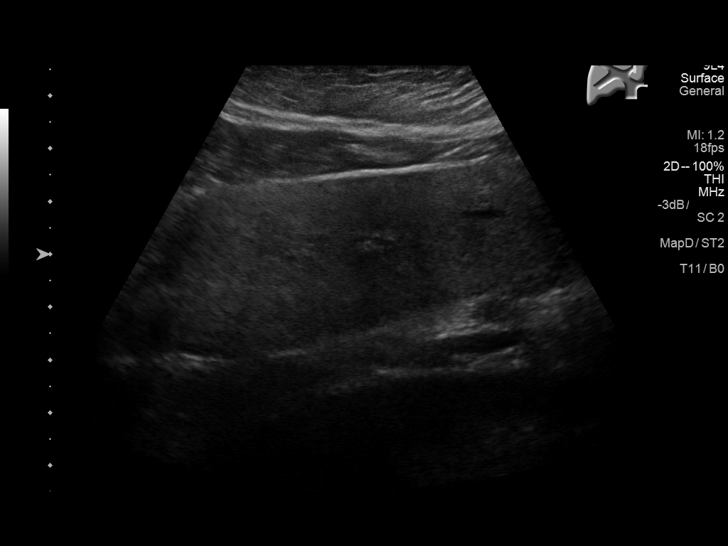
[im 31/61]
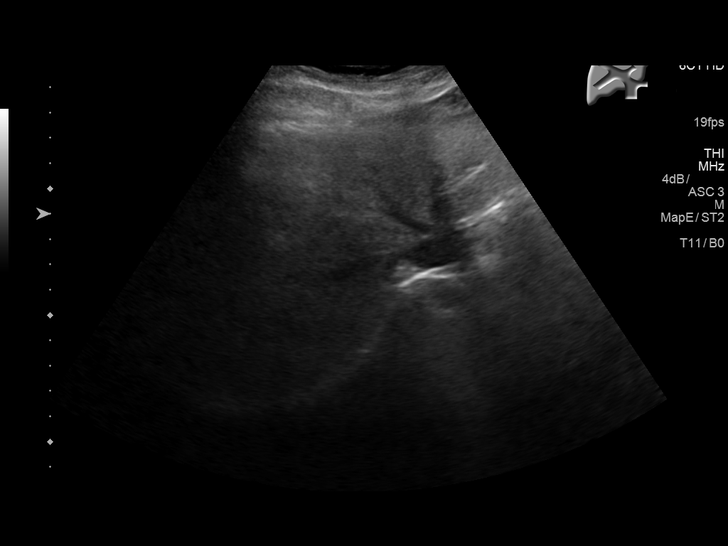
[im 36/61]
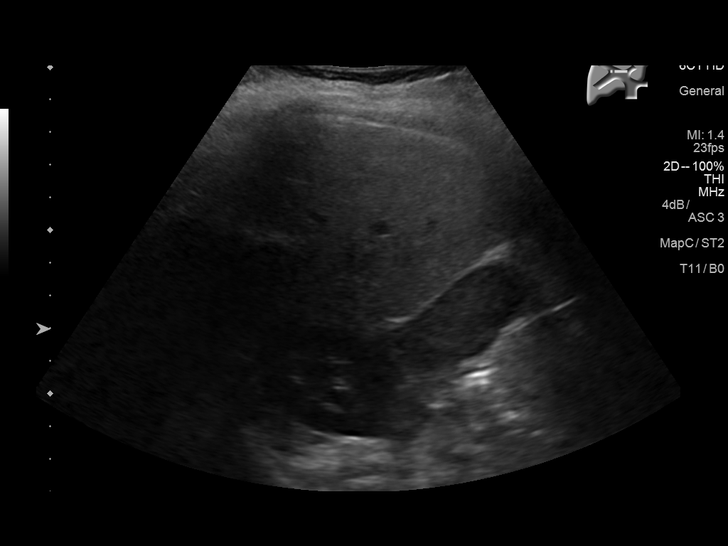
[im 41/61]
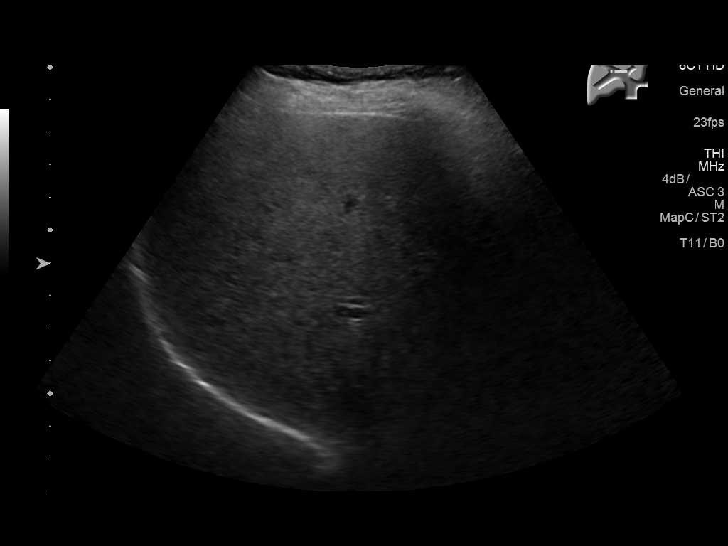
[im 46/61]
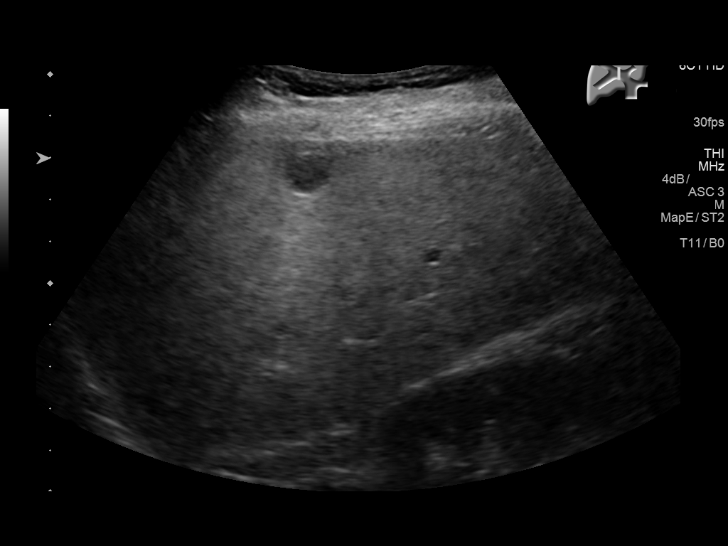
[im 51/61]
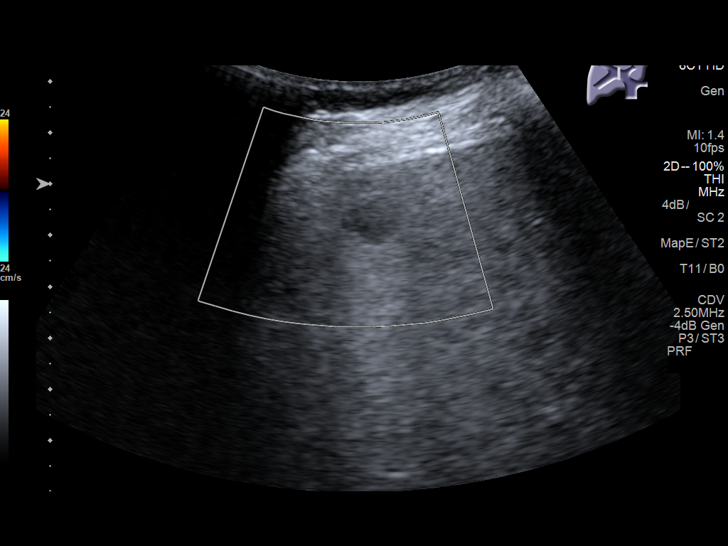
[im 56/61]
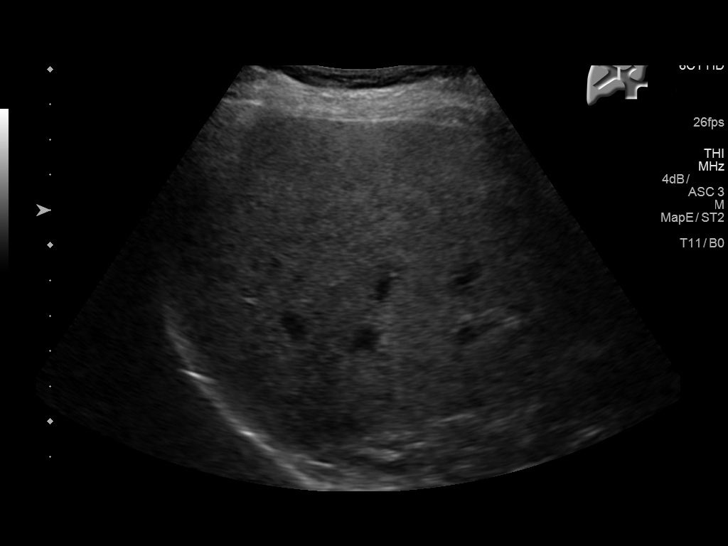
[im 61/61]
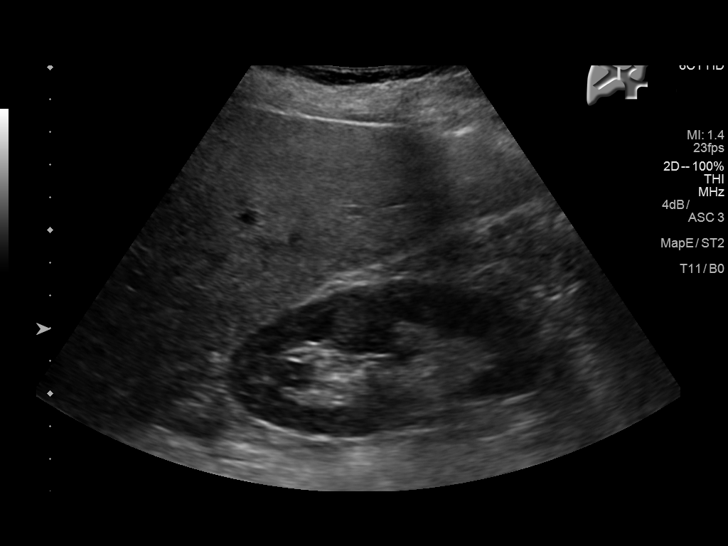

[13 of 25 positions shown; findings below may reference images not displayed]

FINDINGS: Gallbladder:

The gallbladder is adequately distended and filled with echogenic
bile or sludge. No discrete stones are observed. There is no
gallbladder wall thickening, pericholecystic fluid, or positive
sonographic Murphy's sign.

Common bile duct:

Diameter: 3.2 mm.  No intraluminal stones or sludge are observed.

Liver:

The hepatic echotexture is mildly increased diffusely. There is a
hypoechoic focus in the right hepatic lobe near the dome which is
stable and measures 1.1 cm in greatest dimension. A previously
demonstrated cyst in the left hepatic lobe is not visible on today's
study. There is no intrahepatic ductal dilation. The surface contour
of the liver remains smooth.
IMPRESSION: The gallbladder is moderately distended with sludge. No stones or
sonographic evidence of acute cholecystitis.

Increased hepatic echotexture likely reflects fatty infiltrative
change though other infiltrative processes including cirrhosis are
not excluded. No suspicious hepatic masses. One previously described
right lobe cystic lesion is observed and is stable. A previously
demonstrated left lobe cystic lesion is not evident on today's
study.

## 2018-09-01 ENCOUNTER — Telehealth: Payer: Self-pay | Admitting: *Deleted

## 2018-09-01 ENCOUNTER — Telehealth: Payer: Self-pay

## 2018-09-01 ENCOUNTER — Encounter: Payer: Self-pay | Admitting: *Deleted

## 2018-09-01 NOTE — Telephone Encounter (Signed)
Call pt regarding lung screening. Neither phone number work are connected.

## 2018-09-01 NOTE — Telephone Encounter (Signed)
Patients phones were disconnected, called pt's contact and she reported that the patient was deceased, letter earlier printed, not mailed.

## 2018-12-18 ENCOUNTER — Telehealth: Payer: Self-pay

## 2018-12-18 NOTE — Telephone Encounter (Signed)
Called patient from recall list.  NO answer x3 Number is disconnected.  This is the third attempt.
